# Patient Record
Sex: Male | Born: 1990 | Race: Black or African American | Hispanic: No | Marital: Single | Smoking: Current every day smoker
Health system: Southern US, Community
[De-identification: ages and names within clinical notes are randomized; demographics above are authoritative.]

## PROBLEM LIST (undated history)

## (undated) DIAGNOSIS — S62319A Displaced fracture of base of unspecified metacarpal bone, initial encounter for closed fracture: Secondary | ICD-10-CM

---
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
## 2011-01-25 NOTE — Op Note (Signed)
[place]  Patient:    Nathaniel Richards, Nathaniel Richards Visit Number: [id number] MRN: [id number]          Service Type: DSU Location: [facility] Attending Physician:  Leonia Corona Dictated by:   Hyman Bible. Pendse, M.D. Proc. Date: 05/28/01 Admit Date:  05/28/2001   CC:         Gwenlyn Perking, M.D., [facility]   Operative Report  PREOPERATIVE DIAGNOSIS:  Soft tissue mass, left scapular area.  POSTOPERATIVE DIAGNOSIS:  Soft tissue mass, intramuscular, located in the left trapezius muscle and scapular area.  PROCEDURE:  Excision of soft tissue mass in left trapezius muscle.  SURGEON:  Prabhakar D. Levie Heritage, M.D.  ASSISTANT:  Nurse.  ANESTHESIA:  Nurse.  DESCRIPTION OF PROCEDURE:  Under satisfactory general endotracheal anesthesia, patient in prone position.  Left scapular region was thoroughly prepped and draped in the usual manner.  About a 4 cm long transverse incision was made directly over the palpable mass of the left scapular region, skin and subcutaneous tissue incised, bleeders individually clamped, cut, and electrocoagulated.  Incision carried through the deeper layers.  The mass could not be seen in the subcutaneous plane; hence, it was necessary to dissect the trapezius muscle.  The mass was located deep inside the trapezius and supraspinatus muscle complex.  It was not a well-defined mass; hence, the mass lesion together with the layer of the muscle tissue were excised by gross dissection.  It is possible that I might have entered within the substance of the mass.  The entire mass was removed as well as some remnant over the lateral aspect was removed.  The defect in the muscle was repaired with 3-0 Vicryl inverting interrupted sutures.  Hemostasis accomplished.  Wound was irrigated and subcutaneous tissue apposed with 5-0 Vicryl, skin closed with 5-0 nylon interrupted sutures.  A Penrose drain was left in the depths of  the wound prior to closure, and 0.25% Marcaine with epinephrine was injected locally for postop analgesia.  Appropriate dressing applied.  Throughout the procedure, patients vital signs remained stable.  The patient withstood the procedure well and was transferred to the recovery room in satisfactory general condition. Dictated by:   Hyman Bible Pendse, M.D. Attending Physician:  Leonia Corona DD:  05/28/01 TD:  05/28/01 Job: [id number]

## 2011-07-05 ENCOUNTER — Inpatient Hospital Stay (INDEPENDENT_AMBULATORY_CARE_PROVIDER_SITE_OTHER)
Admission: RE | Admit: 2011-07-05 | Discharge: 2011-07-05 | Disposition: A | Discharge status: Home / Self Care | Payer: Self-pay | Source: Ambulatory Visit | Attending: Family Medicine | Admitting: Family Medicine

## 2011-07-05 DIAGNOSIS — S40019A Contusion of unspecified shoulder, initial encounter: Secondary | ICD-10-CM

## 2011-07-05 DIAGNOSIS — S7010XA Contusion of unspecified thigh, initial encounter: Secondary | ICD-10-CM

## 2011-07-05 DIAGNOSIS — S20219A Contusion of unspecified front wall of thorax, initial encounter: Secondary | ICD-10-CM

## 2011-07-05 LAB — POCT URINALYSIS DIP (DEVICE)
Bilirubin Urine: NEGATIVE
Glucose, UA: NEGATIVE mg/dL
Hgb urine dipstick: NEGATIVE
Ketones, ur: NEGATIVE mg/dL
Leukocytes, UA: NEGATIVE
Nitrite: NEGATIVE
Protein, ur: NEGATIVE mg/dL
Specific Gravity, Urine: 1.02 (ref 1.005–1.030)
Urobilinogen, UA: 0.2 mg/dL (ref 0.0–1.0)
pH: 7 (ref 5.0–8.0)

## 2012-10-01 ENCOUNTER — Emergency Department (HOSPITAL_COMMUNITY)
Admission: EM | Admit: 2012-10-01 | Discharge: 2012-10-01 | Disposition: A | Discharge status: Home / Self Care | Payer: Managed Care, Other (non HMO) | Attending: Emergency Medicine | Admitting: Emergency Medicine

## 2012-10-01 ENCOUNTER — Encounter (HOSPITAL_COMMUNITY): Payer: Self-pay | Admitting: *Deleted

## 2012-10-01 ENCOUNTER — Emergency Department (HOSPITAL_COMMUNITY): Payer: Managed Care, Other (non HMO)

## 2012-10-01 DIAGNOSIS — Z9889 Other specified postprocedural states: Secondary | ICD-10-CM | POA: Insufficient documentation

## 2012-10-01 DIAGNOSIS — Y929 Unspecified place or not applicable: Secondary | ICD-10-CM | POA: Insufficient documentation

## 2012-10-01 DIAGNOSIS — Y9389 Activity, other specified: Secondary | ICD-10-CM | POA: Insufficient documentation

## 2012-10-01 DIAGNOSIS — F17200 Nicotine dependence, unspecified, uncomplicated: Secondary | ICD-10-CM | POA: Insufficient documentation

## 2012-10-01 DIAGNOSIS — W108XXA Fall (on) (from) other stairs and steps, initial encounter: Secondary | ICD-10-CM | POA: Insufficient documentation

## 2012-10-01 DIAGNOSIS — IMO0002 Reserved for concepts with insufficient information to code with codable children: Secondary | ICD-10-CM

## 2012-10-01 DIAGNOSIS — S2341XA Sprain of ribs, initial encounter: Secondary | ICD-10-CM | POA: Insufficient documentation

## 2012-10-01 MED ORDER — METHOCARBAMOL 500 MG PO TABS: 500.0000 mg | ORAL_TABLET | Freq: Two times a day (BID) | ORAL | Status: DC

## 2012-10-01 MED ORDER — OXYCODONE-ACETAMINOPHEN 5-325 MG PO TABS: 2.0000 | ORAL_TABLET | Freq: Four times a day (QID) | ORAL | Status: DC | PRN

## 2012-10-01 MED ORDER — OXYCODONE-ACETAMINOPHEN 5-325 MG PO TABS
2.0000 | ORAL_TABLET | Freq: Once | ORAL | Status: AC
  Administered 2012-10-01: 2 via ORAL
  Filled 2012-10-01: qty 2

## 2012-10-01 NOTE — ED Notes (Signed)
Pt hurried down stairs on Sunday and fell down half a flight of stairs, got up and fell down the other flight of stairs.  Denies loc.  Pt states pain shoots from R thigh to R armpit.  No relief from ibuprofen.  Pt appears in great pain.

## 2012-10-01 NOTE — ED Provider Notes (Signed)
History   This chart was scribed for non-physician practitioner working with Richardean Canal, MD by Gerlean Ren, ED Scribe. This patient was seen in room TR10C/TR10C and the patient's care was started at 5:39 PM.    CSN: [id number]  Arrival date & time 10/01/12  1346   First MD Initiated Contact with Patient 10/01/12 1709      Chief Complaint  Patient presents with  . Back Pain     The history is provided by the patient. No language interpreter was used.   Nathaniel Richards is a 22 y.o. male who presents to the Emergency Department complaining of constant, non-changing, sharp, non-radiating right side rib pain rated as 10/10 with sudden onset after falling down half of a flight of stairs when rushing downstairs 01/19.  No noted head trauma or LOC during fall.  Ibuprofen and tylenol used with no relief to pain.  Pain worsened when ambulating and deep breaths and improved lying supine.  No weakness or numbness.  No known allergies.  Pt is a current everyday smoker and reports occasional alcohol use.   History reviewed. No pertinent past medical history.  Past Surgical History  Procedure Date  . Back surgery     when pt in 1st grade    No family history on file.  History  Substance Use Topics  . Smoking status: Current Every Day Smoker -- 0.1 packs/day    Types: Cigarettes  . Smokeless tobacco: Not on file  . Alcohol Use: Yes     Comment: occasionally      Review of Systems  Musculoskeletal: Negative for back pain.  Neurological: Negative for weakness and numbness.    Allergies  Review of patient's allergies indicates no known allergies.  Home Medications   Current Outpatient Rx  Name  Route  Sig  Dispense  Refill  . ACETAMINOPHEN 500 MG PO TABS   Oral   Take 1,000 mg by mouth once. For pain         . IBUPROFEN 600 MG PO TABS   Oral   Take 1,800 mg by mouth once.           BP 142/100  Pulse 71  Temp 98.3 F (36.8 C) (Oral)  Resp 20  SpO2 95%  Physical  Exam  Nursing note and vitals reviewed. Constitutional: He is oriented to person, place, and time. He appears well-developed and well-nourished. No distress.  HENT:  Head: Normocephalic and atraumatic.  Eyes: EOM are normal.  Neck: Normal range of motion. Neck supple.  Pulmonary/Chest: Effort normal. No respiratory distress.  Musculoskeletal: Normal range of motion.       Tenderness over right ribs and right flank, no contusions or deformities, no tenderness over spine or bilateral hips.  Neurological: He is alert and oriented to person, place, and time.  Skin: Skin is warm and dry.  Psychiatric: He has a normal mood and affect. His behavior is normal.    ED Course  Procedures (including critical care time) DIAGNOSTIC STUDIES: Oxygen Saturation is 95% on room air, adequate by my interpretation.    COORDINATION OF CARE: 5:41 PM- Patient informed of clinical course including XR, understands medical decision-making process, and agrees with plan.   Dg Ribs Unilateral W/chest Right  10/01/2012  *RADIOLOGY REPORT*  Clinical Data: Larey Seat down stairs.  Right axillary rib pain.  RIGHT RIBS AND CHEST - 3+ VIEW  Comparison: None.  Findings: Lungs are clear bilaterally. Heart and mediastinum are within normal limits.  Trachea is midline.  Negative for a pneumothorax.  No evidence for a displaced right rib fracture.  IMPRESSION: No acute chest findings.  No evidence for a displaced right rib fracture.   Original Report Authenticated By: Richarda Overlie, M.D.      1. Sprain and strain of ribs       MDM  22 year old male and fell on the stairs, no evidence for rib fracture, though I am still mildly suspicious. Will treat the patient with pain medicine and muscle relaxer, and have him followup with his primary care doctor.  I personally performed the services described in this documentation, which was scribed in my presence. The recorded information has been reviewed and is  accurate.          Roxy Horseman, PA-C 10/01/12 1826

## 2012-10-01 NOTE — ED Notes (Signed)
Pt states he fell down a flight of stairs on Sunday and c/o right side pain. Pt states pain is worse with any movement. Pt took ibuprofen and tylenol with no relief. Pt able to ambulate with quick steady gait. NAD noted.

## 2012-10-01 NOTE — ED Provider Notes (Signed)
Medical screening examination/treatment/procedure(s) were performed by non-physician practitioner and as supervising physician I was immediately available for consultation/collaboration.   Richardean Canal, MD 10/01/12 [phone]

## 2013-05-15 ENCOUNTER — Emergency Department (HOSPITAL_COMMUNITY)
Admission: EM | Admit: 2013-05-15 | Discharge: 2013-05-15 | Disposition: A | Discharge status: Home / Self Care | Payer: Managed Care, Other (non HMO) | Attending: Emergency Medicine | Admitting: Emergency Medicine

## 2013-05-15 ENCOUNTER — Encounter (HOSPITAL_COMMUNITY): Payer: Self-pay | Admitting: *Deleted

## 2013-05-15 ENCOUNTER — Emergency Department (HOSPITAL_COMMUNITY): Payer: Managed Care, Other (non HMO)

## 2013-05-15 DIAGNOSIS — Y9389 Activity, other specified: Secondary | ICD-10-CM | POA: Insufficient documentation

## 2013-05-15 DIAGNOSIS — W230XXA Caught, crushed, jammed, or pinched between moving objects, initial encounter: Secondary | ICD-10-CM | POA: Insufficient documentation

## 2013-05-15 DIAGNOSIS — S62319A Displaced fracture of base of unspecified metacarpal bone, initial encounter for closed fracture: Secondary | ICD-10-CM

## 2013-05-15 DIAGNOSIS — F17200 Nicotine dependence, unspecified, uncomplicated: Secondary | ICD-10-CM | POA: Insufficient documentation

## 2013-05-15 DIAGNOSIS — Y9289 Other specified places as the place of occurrence of the external cause: Secondary | ICD-10-CM | POA: Insufficient documentation

## 2013-05-15 DIAGNOSIS — S62306A Unspecified fracture of fifth metacarpal bone, right hand, initial encounter for closed fracture: Secondary | ICD-10-CM

## 2013-05-15 HISTORY — DX: Displaced fracture of base of unspecified metacarpal bone, initial encounter for closed fracture: S62.319A

## 2013-05-15 MED ORDER — OXYCODONE-ACETAMINOPHEN 5-325 MG PO TABS: 2.0000 | ORAL_TABLET | ORAL | Status: DC | PRN

## 2013-05-15 MED ORDER — OXYCODONE-ACETAMINOPHEN 5-325 MG PO TABS
2.0000 | ORAL_TABLET | Freq: Once | ORAL | Status: AC
  Administered 2013-05-15: 2 via ORAL
  Filled 2013-05-15: qty 2

## 2013-05-15 NOTE — Progress Notes (Signed)
Orthopedic Tech Progress Note Patient Details:  Nathaniel Richards 15-Oct-1990 [id number]  Ortho Devices Type of Ortho Device: Ace wrap;Ulna gutter splint Ortho Device/Splint Interventions: Ordered;Application   Jennye Moccasin 05/15/2013, 8:25 PM
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
## 2013-05-15 NOTE — ED Notes (Signed)
Paged ortho for splint.

## 2013-05-15 NOTE — ED Notes (Signed)
Ortho at bedside.
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
## 2013-05-16 NOTE — ED Provider Notes (Signed)
Medical screening examination/treatment/procedure(s) were performed by non-physician practitioner and as supervising physician I was immediately available for consultation/collaboration.  Zaivion Kundrat, MD 05/16/13 1507 

## 2013-05-21 ENCOUNTER — Encounter (HOSPITAL_BASED_OUTPATIENT_CLINIC_OR_DEPARTMENT_OTHER): Payer: Self-pay | Admitting: *Deleted

## 2013-05-24 ENCOUNTER — Ambulatory Visit (HOSPITAL_BASED_OUTPATIENT_CLINIC_OR_DEPARTMENT_OTHER)
Admission: RE | Admit: 2013-05-24 | Discharge: 2013-05-24 | Disposition: A | Discharge status: Home / Self Care | Payer: Managed Care, Other (non HMO) | Source: Ambulatory Visit | Attending: Orthopedic Surgery | Admitting: Orthopedic Surgery

## 2013-05-24 ENCOUNTER — Ambulatory Visit (HOSPITAL_BASED_OUTPATIENT_CLINIC_OR_DEPARTMENT_OTHER): Payer: Managed Care, Other (non HMO) | Admitting: Certified Registered Nurse Anesthetist

## 2013-05-24 ENCOUNTER — Encounter (HOSPITAL_BASED_OUTPATIENT_CLINIC_OR_DEPARTMENT_OTHER): Payer: Self-pay | Admitting: Certified Registered Nurse Anesthetist

## 2013-05-24 ENCOUNTER — Encounter (HOSPITAL_BASED_OUTPATIENT_CLINIC_OR_DEPARTMENT_OTHER)
Admission: RE | Disposition: A | Discharge status: Home / Self Care | Payer: Self-pay | Source: Ambulatory Visit | Attending: Orthopedic Surgery

## 2013-05-24 ENCOUNTER — Encounter (HOSPITAL_BASED_OUTPATIENT_CLINIC_OR_DEPARTMENT_OTHER): Payer: Self-pay | Admitting: *Deleted

## 2013-05-24 DIAGNOSIS — S62309A Unspecified fracture of unspecified metacarpal bone, initial encounter for closed fracture: Secondary | ICD-10-CM

## 2013-05-24 DIAGNOSIS — S62319A Displaced fracture of base of unspecified metacarpal bone, initial encounter for closed fracture: Secondary | ICD-10-CM | POA: Insufficient documentation

## 2013-05-24 DIAGNOSIS — X58XXXA Exposure to other specified factors, initial encounter: Secondary | ICD-10-CM | POA: Insufficient documentation

## 2013-05-24 DIAGNOSIS — F17200 Nicotine dependence, unspecified, uncomplicated: Secondary | ICD-10-CM | POA: Insufficient documentation

## 2013-05-24 HISTORY — DX: Displaced fracture of base of unspecified metacarpal bone, initial encounter for closed fracture: S62.319A

## 2013-05-24 HISTORY — PX: CLOSED REDUCTION FINGER WITH PERCUTANEOUS PINNING: SHX5612

## 2013-05-24 SURGERY — CLOSED REDUCTION, FINGER, WITH PERCUTANEOUS PINNING
Anesthesia: General | Site: Finger | Laterality: Right | Wound class: Clean

## 2013-05-24 MED ORDER — FENTANYL CITRATE 0.05 MG/ML IJ SOLN
INTRAMUSCULAR | Status: DC | PRN
  Administered 2013-05-24: 100 ug via INTRAVENOUS

## 2013-05-24 MED ORDER — OXYCODONE HCL 5 MG/5ML PO SOLN: 5.0000 mg | Freq: Once | ORAL | Status: DC | PRN

## 2013-05-24 MED ORDER — MIDAZOLAM HCL 5 MG/5ML IJ SOLN
INTRAMUSCULAR | Status: DC | PRN
  Administered 2013-05-24: 2 mg via INTRAVENOUS

## 2013-05-24 MED ORDER — KETOROLAC TROMETHAMINE 30 MG/ML IJ SOLN
INTRAMUSCULAR | Status: DC | PRN
  Administered 2013-05-24: 30 mg via INTRAVENOUS

## 2013-05-24 MED ORDER — OXYCODONE-ACETAMINOPHEN 10-325 MG PO TABS: 1.0000 | ORAL_TABLET | ORAL | Status: DC | PRN

## 2013-05-24 MED ORDER — EPHEDRINE SULFATE 50 MG/ML IJ SOLN
INTRAMUSCULAR | Status: DC | PRN
  Administered 2013-05-24 (×4): 10 mg via INTRAVENOUS

## 2013-05-24 MED ORDER — HYDROMORPHONE HCL PF 1 MG/ML IJ SOLN
0.2500 mg | INTRAMUSCULAR | Status: DC | PRN
  Administered 2013-05-24 (×4): 0.5 mg via INTRAVENOUS

## 2013-05-24 MED ORDER — DEXAMETHASONE SODIUM PHOSPHATE 10 MG/ML IJ SOLN
INTRAMUSCULAR | Status: DC | PRN
  Administered 2013-05-24: 10 mg via INTRAVENOUS

## 2013-05-24 MED ORDER — PROPOFOL 10 MG/ML IV BOLUS
INTRAVENOUS | Status: DC | PRN
  Administered 2013-05-24: 250 mg via INTRAVENOUS

## 2013-05-24 MED ORDER — LACTATED RINGERS IV SOLN
INTRAVENOUS | Status: DC
  Administered 2013-05-24 (×2): via INTRAVENOUS

## 2013-05-24 MED ORDER — LIDOCAINE HCL (CARDIAC) 20 MG/ML IV SOLN
INTRAVENOUS | Status: DC | PRN
  Administered 2013-05-24: 100 mg via INTRAVENOUS

## 2013-05-24 MED ORDER — DEXTROSE 5 % IV SOLN
3.0000 g | INTRAVENOUS | Status: AC
  Administered 2013-05-24: 3 g via INTRAVENOUS

## 2013-05-24 MED ORDER — CHLORHEXIDINE GLUCONATE 4 % EX LIQD: 60.0000 mL | Freq: Once | CUTANEOUS | Status: DC

## 2013-05-24 MED ORDER — BUPIVACAINE HCL (PF) 0.5 % IJ SOLN
INTRAMUSCULAR | Status: DC | PRN
  Administered 2013-05-24: 9 mL

## 2013-05-24 MED ORDER — DOCUSATE SODIUM 100 MG PO CAPS: 100.0000 mg | ORAL_CAPSULE | Freq: Two times a day (BID) | ORAL | Status: DC

## 2013-05-24 MED ORDER — OXYCODONE HCL 5 MG PO TABS
5.0000 mg | ORAL_TABLET | Freq: Once | ORAL | Status: DC | PRN
Start: 2013-05-24 — End: 2013-05-24

## 2013-05-24 MED ORDER — MIDAZOLAM HCL 2 MG/2ML IJ SOLN: 1.0000 mg | INTRAMUSCULAR | Status: DC | PRN

## 2013-05-24 MED ORDER — MIDAZOLAM HCL 2 MG/ML PO SYRP: 12.0000 mg | ORAL_SOLUTION | Freq: Once | ORAL | Status: DC | PRN

## 2013-05-24 MED ORDER — FENTANYL CITRATE 0.05 MG/ML IJ SOLN: 50.0000 ug | INTRAMUSCULAR | Status: DC | PRN

## 2013-05-24 SURGICAL SUPPLY — 55 items
APL SKNCLS STERI-STRIP NONHPOA (GAUZE/BANDAGES/DRESSINGS)
BANDAGE CONFORM 3  STR LF (GAUZE/BANDAGES/DRESSINGS) ×1 IMPLANT
BANDAGE ELASTIC 3 VELCRO ST LF (GAUZE/BANDAGES/DRESSINGS) ×2 IMPLANT
BENZOIN TINCTURE PRP APPL 2/3 (GAUZE/BANDAGES/DRESSINGS) IMPLANT
BLADE SURG 15 STRL LF DISP TIS (BLADE) ×2 IMPLANT
BLADE SURG 15 STRL SS (BLADE) ×2
BNDG CMPR 9X4 STRL LF SNTH (GAUZE/BANDAGES/DRESSINGS) ×1
BNDG CMPR MD 5X2 ELC HKLP STRL (GAUZE/BANDAGES/DRESSINGS)
BNDG ELASTIC 2 VLCR STRL LF (GAUZE/BANDAGES/DRESSINGS) IMPLANT
BNDG ESMARK 4X9 LF (GAUZE/BANDAGES/DRESSINGS) ×2 IMPLANT
CANISTER SUCTION 1200CC (MISCELLANEOUS) IMPLANT
CORDS BIPOLAR (ELECTRODE) ×1 IMPLANT
COVER TABLE BACK 60X90 (DRAPES) ×2 IMPLANT
CUFF TOURNIQUET SINGLE 18IN (TOURNIQUET CUFF) IMPLANT
CUFF TOURNIQUET SINGLE 24IN (TOURNIQUET CUFF) ×1 IMPLANT
DECANTER SPIKE VIAL GLASS SM (MISCELLANEOUS) IMPLANT
DRAPE EXTREMITY T 121X128X90 (DRAPE) ×2 IMPLANT
DRAPE OEC MINIVIEW 54X84 (DRAPES) ×2 IMPLANT
DRAPE SURG 17X23 STRL (DRAPES) ×2 IMPLANT
DRSG EMULSION OIL 3X3 NADH (GAUZE/BANDAGES/DRESSINGS) ×2 IMPLANT
GAUZE SPONGE 4X4 16PLY XRAY LF (GAUZE/BANDAGES/DRESSINGS) IMPLANT
GAUZE XEROFORM 1X8 LF (GAUZE/BANDAGES/DRESSINGS) ×1 IMPLANT
GLOVE BIO SURGEON STRL SZ8 (GLOVE) ×2 IMPLANT
GLOVE BIOGEL PI IND STRL 8.5 (GLOVE) ×1 IMPLANT
GLOVE BIOGEL PI INDICATOR 8.5 (GLOVE) ×1
GLOVE EXAM NITRILE EXT CUFF MD (GLOVE) ×1 IMPLANT
GLOVE SURG SS PI 7.0 STRL IVOR (GLOVE) ×1 IMPLANT
GOWN PREVENTION PLUS XLARGE (GOWN DISPOSABLE) ×2 IMPLANT
GOWN STRL REIN 2XL XLG LVL4 (GOWN DISPOSABLE) ×2 IMPLANT
K-WIRE .045X4 (WIRE) ×3 IMPLANT
NDL HYPO 25X1 1.5 SAFETY (NEEDLE) IMPLANT
NEEDLE HYPO 25X1 1.5 SAFETY (NEEDLE) ×2 IMPLANT
NS IRRIG 1000ML POUR BTL (IV SOLUTION) ×1 IMPLANT
PACK BASIN DAY SURGERY FS (CUSTOM PROCEDURE TRAY) ×2 IMPLANT
PAD CAST 3X4 CTTN HI CHSV (CAST SUPPLIES) ×1 IMPLANT
PADDING CAST ABS 3INX4YD NS (CAST SUPPLIES)
PADDING CAST ABS COTTON 3X4 (CAST SUPPLIES) ×1 IMPLANT
PADDING CAST COTTON 3X4 STRL (CAST SUPPLIES) ×4
PADDING UNDERCAST 2  STERILE (CAST SUPPLIES) IMPLANT
SHEET MEDIUM DRAPE 40X70 STRL (DRAPES) ×1 IMPLANT
SPLINT FIBERGLASS 3X35 (CAST SUPPLIES) IMPLANT
SPLINT FIBERGLASS 4X30 (CAST SUPPLIES) ×1 IMPLANT
STOCKINETTE 4X48 STRL (DRAPES) ×2 IMPLANT
STRIP CLOSURE SKIN 1/2X4 (GAUZE/BANDAGES/DRESSINGS) IMPLANT
SUCTION FRAZIER TIP 10 FR DISP (SUCTIONS) ×1 IMPLANT
SUT MNCRL AB 3-0 PS2 18 (SUTURE) ×1 IMPLANT
SUT MON AB 4-0 PC3 18 (SUTURE) IMPLANT
SUT PROLENE 4 0 PS 2 18 (SUTURE) ×1 IMPLANT
SUT VIC AB 3-0 FS2 27 (SUTURE) ×1 IMPLANT
SYR BULB 3OZ (MISCELLANEOUS) ×1 IMPLANT
SYR CONTROL 10ML LL (SYRINGE) ×1 IMPLANT
TOWEL OR 17X24 6PK STRL BLUE (TOWEL DISPOSABLE) ×3 IMPLANT
TRAY DSU PREP LF (CUSTOM PROCEDURE TRAY) ×2 IMPLANT
TUBE CONNECTING 20X1/4 (TUBING) IMPLANT
UNDERPAD 30X30 INCONTINENT (UNDERPADS AND DIAPERS) ×2 IMPLANT

## 2013-05-24 NOTE — Anesthesia Procedure Notes (Signed)
Procedure Name: LMA Insertion Date/Time: 05/24/2013 11:53 AM Performed by: Caren Macadam Pre-anesthesia Checklist: Patient identified, Emergency Drugs available, Suction available and Patient being monitored Patient Re-evaluated:Patient Re-evaluated prior to inductionOxygen Delivery Method: Circle System Utilized Preoxygenation: Pre-oxygenation with 100% oxygen Intubation Type: IV induction Ventilation: Mask ventilation without difficulty LMA: LMA inserted LMA Size: 5.0 Number of attempts: 1 Airway Equipment and Method: bite block Placement Confirmation: positive ETCO2 and breath sounds checked- equal and bilateral Tube secured with: Tape Dental Injury: Teeth and Oropharynx as per pre-operative assessment

## 2013-05-24 NOTE — H&P (Signed)
Nathaniel Richards is an 22 y.o. male.   Chief Complaint: right hand injury HPI: Pt sustained injury to right hand Pt followed in office with displaced metacarpal base fracture Pt here for surgery No prior injury to hand  Past Medical History  Diagnosis Date  . Fracture of metacarpal base of right hand, closed 05/15/2013    small finger    Past Surgical History  Procedure Laterality Date  . Soft tissue mass excision Left 05/28/2001    trapezius muscle    History reviewed. No pertinent family history. Social History:  reports that he has been smoking Cigarettes.  He has been smoking about 0.00 packs per day for the past 4 years. He has never used smokeless tobacco. He reports that  drinks alcohol. He reports that he does not use illicit drugs.  Allergies: No Known Allergies  Medications Prior to Admission  Medication Sig Dispense Refill  . oxyCODONE-acetaminophen (PERCOCET/ROXICET) 5-325 MG per tablet Take 2 tablets by mouth every 4 (four) hours as needed for pain.  9 tablet  0    No results found for this or any previous visit (from the past 48 hour(s)). No results found.  NO RECENT ILLNESSES OR HOSPITALIZATIONS  Blood pressure 137/87, pulse 71, temperature 98.5 F (36.9 C), temperature source Oral, resp. rate 20, height 6\' 2"  (1.88 m), weight 161.934 kg (357 lb), SpO2 98.00%. General Appearance:  Alert, cooperative, no distress, appears stated age  Head:  Normocephalic, without obvious abnormality, atraumatic  Eyes:  Pupils equal, conjunctiva/corneas clear,         Throat: Lips, mucosa, and tongue normal; teeth and gums normal  Neck: No visible masses     Lungs:   respirations unlabored  Chest Wall:  No tenderness or deformity  Heart:  Regular rate and rhythm,  Abdomen:   Soft, non-tender,         Extremities: RIGHT HAND: ULNAR GUTTER SPLINT GOOD MOBILITY OF INDEX AND LONG FINGER AND THUMB  FINGERS WARM WELL PERFUSED  Pulses: 2+ and symmetric  Skin: Skin color,  texture, turgor normal, no rashes or lesions     Neurologic: Normal   Assessment/Plan RIGHT SMALL FINGER METACARPAL BASE FRACTURE DISPLACED AND INTRAARTICULAR  RIGHT SMALL FINGER CLOSED REDUCTION AND PINNING POSSIBLE OPEN REDUCTION AND INTERNAL FIXATION  R/B/A DISCUSSED WITH PT IN OFFICE.  PT VOICED UNDERSTANDING OF PLAN CONSENT SIGNED DAY OF SURGERY PT SEEN AND EXAMINED PRIOR TO OPERATIVE PROCEDURE/DAY OF SURGERY SITE MARKED. QUESTIONS ANSWERED WILL GO HOME FOLLOWING SURGERY  [name] 05/24/2013, 11:40 AM

## 2013-05-24 NOTE — Anesthesia Postprocedure Evaluation (Signed)
  Anesthesia Post-op Note  Patient: Nathaniel Richards  Procedure(s) Performed: Procedure(s): RIGHT SMALL FINGER CLOSED REDUCTION  WITH PERCUTANEOUS PINNING (Right)  Patient Location: PACU  Anesthesia Type:General  Level of Consciousness: awake, alert  and oriented  Airway and Oxygen Therapy: Patient Spontanous Breathing  Post-op Pain: mild  Post-op Assessment: Post-op Vital signs reviewed, Patient's Cardiovascular Status Stable, Respiratory Function Stable, Patent Airway and No signs of Nausea or vomiting  Post-op Vital Signs: Reviewed and stable  Complications: No apparent anesthesia complications

## 2013-05-24 NOTE — Transfer of Care (Signed)
Immediate Anesthesia Transfer of Care Note  Patient: Nathaniel Richards  Procedure(s) Performed: Procedure(s): RIGHT SMALL FINGER CLOSED REDUCTION  WITH PERCUTANEOUS PINNING (Right)  Patient Location: PACU  Anesthesia Type:General  Level of Consciousness: awake and alert   Airway & Oxygen Therapy: Patient Spontanous Breathing and Patient connected to face mask oxygen  Post-op Assessment: Report given to PACU RN and Post -op Vital signs reviewed and stable  Post vital signs: Reviewed and stable  Complications: No apparent anesthesia complications
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
## 2013-05-24 NOTE — Brief Op Note (Signed)
05/24/2013  11:44 AM  PATIENT:  Nathaniel Richards  22 y.o. male  PRE-OPERATIVE DIAGNOSIS:  Right Small Finger Metacarpal Base Fracture  POST-OPERATIVE DIAGNOSIS:  RIGHT SMALL FINGER METACARPAL BASE FRACTURE  PROCEDURE:  Procedure(s): RIGHT SMALL FINGER CLOSED REDUCTION  WITH PERCUTANEOUS PINNING (Right) POSSIBLE OPEN REDUCTION INTERNAL FIXATION (ORIF) METACARPAL RIGHT SMALL FINGER (Right)  SURGEON:  Surgeon(s) and Role:    * [name], MD - Primary  PHYSICIAN ASSISTANT: NONE  ASSISTANTS: none   ANESTHESIA:   general  EBL:     BLOOD ADMINISTERED:none  DRAINS: none   LOCAL MEDICATIONS USED:  MARCAINE     SPECIMEN:  No Specimen  DISPOSITION OF SPECIMEN:  N/A  COUNTS:  YES  TOURNIQUET:    DICTATION: .[id number]  PLAN OF CARE: Discharge to home after PACU  PATIENT DISPOSITION:  PACU - hemodynamically stable.   Delay start of Pharmacological VTE agent (>24hrs) due to surgical blood loss or risk of bleeding: not applicable

## 2013-05-25 ENCOUNTER — Encounter (HOSPITAL_BASED_OUTPATIENT_CLINIC_OR_DEPARTMENT_OTHER): Payer: Self-pay | Admitting: Orthopedic Surgery

## 2013-05-25 NOTE — Op Note (Deleted)
NAME:  Nathaniel Richards, Nathaniel Richards              ACCOUNT NO.:  [id number]  MEDICAL RECORD NO.:  [id number]  LOCATION:  TR05C                        FACILITY:  [facility]  PHYSICIAN:  Reniya Mcclees W Ainara Eldridge IV, MD  DATE OF BIRTH:  09/27/1990  DATE OF PROCEDURE:  05/24/2013 DATE OF DISCHARGE:  05/24/2013                              OPERATIVE REPORT   PREOPERATIVE DIAGNOSIS:  Right small finger metacarpal base fracture involving the articular surface of the CMC joint, reverse Bennett type equivalent.  POSTOPERATIVE DIAGNOSIS:  Right small finger metacarpal base fracture involving the articular surface of the CMC joint, reverse Bennett type equivalent.  ATTENDING PHYSICIAN:  Ugo Thoma W Darry Kelnhofer IV, MD, who scrubbed and present for the entire procedure.  ASSISTANT SURGEON:  None.  ANESTHESIA:  General via LMA.  TOURNIQUET TIME:  0 minutes.  SURGICAL PROCEDURE: 1. Closed reduction and percutaneous skeletal fixation of unstable     metacarpal base fracture involving the articular surface of the     thumb CMC joint, reverse Bennett fracture. 2. Radiographs, 3 views, right hand.  SURGICAL IMPLANTS:  Three 0.045 K-wires.  SURGICAL INDICATIONS:  Nathaniel Richards is a right-hand-dominant gentleman, who sustained a closed injury to his right hand.  The patient was seen and evaluated in the office, and based on his injuries, recommended that he undergo the above procedure.  Risks, benefits, and alternatives were discussed in detail with the patient.  Signed informed consent was obtained.  Risks include, but not limited to bleeding, infection, damage to nearby nerves, arteries, or tendons, loss motion of wrist and digits, incomplete relief of symptoms, and need for further surgical intervention.  DESCRIPTION OF PROCEDURE:  The patient was properly identified in the preop holding area and marked with a permanent marker made on the right hand to indicate the correct operative site.  The patient was then brought back to  the operating room and placed supine on the anesthesia room table.  General anesthesia was administered.  The patient tolerated this well.  A well-padded tourniquet was then placed in the right brachium and sealed with 1000 drape.  The right upper extremity was then prepped and draped normal sterile fashion.  Time-out was called, correct side was identified, and procedure then begun.  Attention was then turned to the right hand where after the small finger was then brought out in a longitudinal traction.  This reduced the metacarpal base fracture nicely.  Following this, two 0.045 K-wires were then placed from the small finger metacarpal shaft into the ring finger metacarpal shaft.  After this, an additional third K-wire was then placed in a metacarpal shaft across the CMC joint across the fracture site stabilizing the joint even further.  The K-wires were then cut and bent and left out of the skin.  A 10 mL 0.25% Marcaine infiltrated locally. Xeroform bolster dressing was then applied around the pin sites.  Final radiographs of the hand were then obtained.  The patient was then placed in a well-padded ulnar gutter splint, extubated, and taken to recovery room in good condition.  RADIOGRAPHS:  Three views of the hand do show the K-wire fixation in place.  There was good position in both planes.  POSTPROCEDURE   PLAN:  The patient will be discharged to home, seen back in the office in approximately 2 weeks for wound check, pin check, x- rays out of the splint, application of short-arm ulnar gutter cast for a total of 2 more weeks, pins out at the 4-week mark, then transition to an ulnar gutter splint with therapy working on his active range of motion.  Radiographs at that visit.     Elwin Tsou W Ryan Ogborn IV, MD     FWO/MEDQ  D:  05/24/2013  T:  05/25/2013  Job:  [id number] 

## 2013-05-25 NOTE — Op Note (Signed)
Nathaniel Richards, Nathaniel Richards              ACCOUNT NO.:  [id number]  MEDICAL RECORD NO.:  [id number]  LOCATION:  TR05C                        FACILITY:  [facility]  PHYSICIAN:  Madelynn Done, MD  DATE OF BIRTH:  02/14/1991  DATE OF PROCEDURE:  05/24/2013 DATE OF DISCHARGE:  05/24/2013                              OPERATIVE REPORT   PREOPERATIVE DIAGNOSIS:  Right small finger metacarpal base fracture involving the articular surface of the CMC joint, reverse Bennett type equivalent.  POSTOPERATIVE DIAGNOSIS:  Right small finger metacarpal base fracture involving the articular surface of the CMC joint, reverse Bennett type equivalent.  ATTENDING PHYSICIAN:  Madelynn Done, MD, who scrubbed and present for the entire procedure.  ASSISTANT SURGEON:  None.  ANESTHESIA:  General via LMA.  TOURNIQUET TIME:  0 minutes.  SURGICAL PROCEDURE: 1. Closed reduction and percutaneous skeletal fixation of unstable     metacarpal base fracture involving the articular surface of the     thumb CMC joint, reverse Bennett fracture. 2. Radiographs, 3 views, right hand.  SURGICAL IMPLANTS:  Three 0.045 K-wires.  SURGICAL INDICATIONS:  Mr. Lahaie is a right-hand-dominant gentleman, who sustained a closed injury to his right hand.  The patient was seen and evaluated in the office, and based on his injuries, recommended that he undergo the above procedure.  Risks, benefits, and alternatives were discussed in detail with the patient.  Signed informed consent was obtained.  Risks include, but not limited to bleeding, infection, damage to nearby nerves, arteries, or tendons, loss motion of wrist and digits, incomplete relief of symptoms, and need for further surgical intervention.  DESCRIPTION OF PROCEDURE:  The patient was properly identified in the preop holding area and marked with a permanent marker made on the right hand to indicate the correct operative site.  The patient was then brought back to  the operating room and placed supine on the anesthesia room table.  General anesthesia was administered.  The patient tolerated this well.  A well-padded tourniquet was then placed in the right brachium and sealed with 1000 drape.  The right upper extremity was then prepped and draped normal sterile fashion.  Time-out was called, correct side was identified, and procedure then begun.  Attention was then turned to the right hand where after the small finger was then brought out in a longitudinal traction.  This reduced the metacarpal base fracture nicely.  Following this, two 0.045 K-wires were then placed from the small finger metacarpal shaft into the ring finger metacarpal shaft.  After this, an additional third K-wire was then placed in a metacarpal shaft across the [facility] joint across the fracture site stabilizing the joint even further.  The K-wires were then cut and bent and left out of the skin.  A 10 mL 0.25% Marcaine infiltrated locally. Xeroform bolster dressing was then applied around the pin sites.  Final radiographs of the hand were then obtained.  The patient was then placed in a well-padded ulnar gutter splint, extubated, and taken to recovery room in good condition.  RADIOGRAPHS:  Three views of the hand do show the K-wire fixation in place.  There was good position in both planes.  POSTPROCEDURE  PLAN:  The patient will be discharged to home, seen back in the office in approximately 2 weeks for wound check, pin check, x- rays out of the splint, application of short-arm ulnar gutter cast for a total of 2 more weeks, pins out at the 4-week mark, then transition to an ulnar gutter splint with therapy working on his active range of motion.  Radiographs at that visit.     Madelynn Done, MD     FWO/MEDQ  D:  05/24/2013  T:  05/25/2013  Job:  [id number]

## 2013-10-10 ENCOUNTER — Encounter (HOSPITAL_COMMUNITY): Payer: Self-pay | Admitting: Emergency Medicine

## 2013-10-10 ENCOUNTER — Emergency Department (HOSPITAL_COMMUNITY)
Admission: EM | Admit: 2013-10-10 | Discharge: 2013-10-10 | Disposition: A | Discharge status: Home / Self Care | Payer: Managed Care, Other (non HMO) | Attending: Emergency Medicine | Admitting: Emergency Medicine

## 2013-10-10 DIAGNOSIS — F17200 Nicotine dependence, unspecified, uncomplicated: Secondary | ICD-10-CM | POA: Insufficient documentation

## 2013-10-10 DIAGNOSIS — Z8781 Personal history of (healed) traumatic fracture: Secondary | ICD-10-CM | POA: Insufficient documentation

## 2013-10-10 DIAGNOSIS — L0591 Pilonidal cyst without abscess: Secondary | ICD-10-CM

## 2013-10-10 DIAGNOSIS — Z79899 Other long term (current) drug therapy: Secondary | ICD-10-CM | POA: Insufficient documentation

## 2013-10-10 MED ORDER — HYDROCODONE-ACETAMINOPHEN 5-325 MG PO TABS
1.0000 | ORAL_TABLET | Freq: Once | ORAL | Status: AC
  Administered 2013-10-10: 1 via ORAL
  Filled 2013-10-10: qty 1

## 2013-10-10 MED ORDER — LIDOCAINE-EPINEPHRINE (PF) 2 %-1:200000 IJ SOLN
INTRAMUSCULAR | Status: AC
  Filled 2013-10-10: qty 10

## 2013-10-10 MED ORDER — HYDROCODONE-ACETAMINOPHEN 5-325 MG PO TABS: 1.0000 | ORAL_TABLET | ORAL | Status: DC | PRN

## 2013-10-10 MED ORDER — METRONIDAZOLE 500 MG PO TABS: 500.0000 mg | ORAL_TABLET | Freq: Three times a day (TID) | ORAL | Status: DC

## 2013-10-10 MED ORDER — AMOXICILLIN-POT CLAVULANATE 875-125 MG PO TABS: 1.0000 | ORAL_TABLET | Freq: Two times a day (BID) | ORAL | Status: DC

## 2013-10-10 MED ORDER — LIDOCAINE-EPINEPHRINE (PF) 2 %-1:200000 IJ SOLN: 20.0000 mL | Freq: Once | INTRAMUSCULAR | Status: DC

## 2013-10-10 NOTE — Discharge Instructions (Signed)
Call for a follow up appointment with a Family or Primary Care Provider in 2-3 days for wound check and removal of the packing. Don't soak the area until the packing is removed. Call Dr. Daphine DeutscherMartin for further treatment of your recurring pilonidal cyst. Return if Symptoms worsen.   Take medication as prescribed.

## 2013-10-10 NOTE — ED Provider Notes (Signed)
CSN: [id number]     Arrival date & time 10/10/13  1212 History   First MD Initiated Contact with Patient 10/10/13 1224     Chief Complaint  Patient presents with  . Cyst  . Pain   (Consider location/radiation/quality/duration/timing/severity/associated sxs/prior Treatment) HPI Comments: Nathaniel Richards is a 23 year-old male , presenting the Emergency Department with a chief complaint of Abscess to gluteal cleft.  He reports he has been able to "pop" previous episodes of swelling and pain.  He reports he has tried warm compresses and squeezing the area without relief.  Denies history of DM or other immunocompromised state.    Patient is a 23 y.o. male presenting with abscess. The history is provided by the patient and medical records. No language interpreter was used.  Abscess Location:  Ano-genital Abscess quality: painful   Abscess quality: not draining   Duration:  1 week Progression:  Worsening Pain details:    Quality:  Pressure and throbbing   Progression:  Worsening (Worsening since Thursday) Associated symptoms: no fever, no nausea and no vomiting     Past Medical History  Diagnosis Date  . Fracture of metacarpal base of right hand, closed 05/15/2013    small finger   Past Surgical History  Procedure Laterality Date  . Soft tissue mass excision Left 05/28/2001    trapezius muscle  . Closed reduction finger with percutaneous pinning Right 05/24/2013    Procedure: RIGHT SMALL FINGER CLOSED REDUCTION  WITH PERCUTANEOUS PINNING;  Surgeon: Sharma CovertFred W Ortmann, MD;  Location: [facility];  Service: Orthopedics;  Laterality: Right;   No family history on file. History  Substance Use Topics  . Smoking status: Current Every Day Smoker -- 4 years    Types: Cigarettes  . Smokeless tobacco: Never Used     Comment: 4-5 cig./day  . Alcohol Use: Yes     Comment: occasionally    Review of Systems  Constitutional: Negative for fever and chills.  Gastrointestinal: Negative  for nausea, vomiting, diarrhea and constipation.  Skin: Positive for color change and wound.    Allergies  Review of patient's allergies indicates no known allergies.  Home Medications   Current Outpatient Rx  Name  Route  Sig  Dispense  Refill  . acetaminophen (TYLENOL) 500 MG tablet   Oral   Take 1,500 mg by mouth once.         . Benzocaine (BOIL EASE MAXIMUM STRENGTH) 20 % OINT   Apply externally   Apply 1 application topically 3 (three) times daily.          BP 159/94  Pulse 68  Temp(Src) 98.2 F (36.8 C) (Oral)  Resp 16  SpO2 99% Physical Exam  Nursing note and vitals reviewed. Constitutional: He is oriented to person, place, and time. He appears well-developed and well-nourished. No distress.  HENT:  Head: Atraumatic.  Eyes: No scleral icterus.  Neck: Neck supple.  Cardiovascular: Normal rate.   Pulmonary/Chest: Effort normal.  Neurological: He is alert and oriented to person, place, and time.  Skin: Skin is dry. He is not diaphoretic.     5 x 3 cm area of fluctuance with surrounding induration.   Psychiatric: He has a normal mood and affect. His behavior is normal.    ED Course  INCISION AND DRAINAGE Date/Time: 10/10/2013 1:10 PM Performed by: Clabe SealPARKER, Kennidi Yoshida M Authorized by: Clabe SealPARKER, Abdiel Blackerby M Consent: Verbal consent obtained. Risks and benefits: risks, benefits and alternatives were discussed Consent given by: patient Patient  understanding: patient states understanding of the procedure being performed Patient consent: the patient's understanding of the procedure matches consent given Procedure consent: procedure consent matches procedure scheduled Required items: required blood products, implants, devices, and special equipment available Patient identity confirmed: verbally with patient and hospital-assigned identification number Time out: Immediately prior to procedure a "time out" was called to verify the correct patient, procedure, equipment, support  staff and site/side marked as required. Type: pilonidal cyst Body area: anogenital Location details: gluteal cleft Anesthesia: local infiltration Local anesthetic: lidocaine 2% with epinephrine Anesthetic total: 22 ml Patient sedated: no Scalpel size: 11 Incision type: single straight Drainage: purulent Wound treatment: wound left open and drain placed Packing material: 1/2 in iodoform gauze Patient tolerance: Patient tolerated the procedure well with no immediate complications.   (including critical care time) Labs Review Labs Reviewed - No data to display Imaging Review No results found.  EKG Interpretation   None       MDM   1. Pilonidal cyst    PT with a pilonidal abscess. Moderate amount of purulent drainage.  Packed and discussed wound check in -3 days.  General surgery referral given. Discussed treatment plan with the patient. Return precautions given. Reports understanding and no other concerns at this time.  Patient is stable for discharge at this time. Meds given in ED:  Medications  HYDROcodone-acetaminophen (NORCO/VICODIN) 5-325 MG per tablet 1 tablet (1 tablet Oral Given 10/10/13 1327)    Discharge Medication List as of 10/10/2013  3:13 PM    START taking these medications   Details  amoxicillin-clavulanate (AUGMENTIN) 875-125 MG per tablet Take 1 tablet by mouth every 12 (twelve) hours., Starting 10/10/2013, Until Discontinued, Print    HYDROcodone-acetaminophen (NORCO/VICODIN) 5-325 MG per tablet Take 1 tablet by mouth every 4 (four) hours as needed., Starting 10/10/2013, Until Discontinued, Print    metroNIDAZOLE (FLAGYL) 500 MG tablet Take 1 tablet (500 mg total) by mouth 3 (three) times daily., Starting 10/10/2013, Until Discontinued, Print              Clabe Seal, PA-C 10/13/13 1301

## 2013-10-10 NOTE — ED Notes (Addendum)
Pt reports cyst/abcess to coccyx, worsening, now draining.  Pt c/o pain to site, 10/10. Appears very uncomfortable.  Onset x1 week ago.  Pt denies fevers/chills. Pt denies other complaints.  Approx 2" in length, swollen, with pencil eraser sized open area noted distally.  Otherwise skin PWD.  MAEI.

## 2013-10-11 ENCOUNTER — Encounter (HOSPITAL_COMMUNITY): Payer: Self-pay | Admitting: Emergency Medicine

## 2013-10-11 ENCOUNTER — Emergency Department (HOSPITAL_COMMUNITY)
Admission: EM | Admit: 2013-10-11 | Discharge: 2013-10-11 | Disposition: A | Discharge status: Home / Self Care | Payer: Managed Care, Other (non HMO) | Attending: Emergency Medicine | Admitting: Emergency Medicine

## 2013-10-11 DIAGNOSIS — Z09 Encounter for follow-up examination after completed treatment for conditions other than malignant neoplasm: Secondary | ICD-10-CM

## 2013-10-11 DIAGNOSIS — Z79899 Other long term (current) drug therapy: Secondary | ICD-10-CM | POA: Insufficient documentation

## 2013-10-11 DIAGNOSIS — Z4801 Encounter for change or removal of surgical wound dressing: Secondary | ICD-10-CM | POA: Insufficient documentation

## 2013-10-11 DIAGNOSIS — Z8781 Personal history of (healed) traumatic fracture: Secondary | ICD-10-CM | POA: Insufficient documentation

## 2013-10-11 DIAGNOSIS — F17200 Nicotine dependence, unspecified, uncomplicated: Secondary | ICD-10-CM | POA: Insufficient documentation

## 2013-10-11 NOTE — ED Provider Notes (Signed)
CSN: [id number]     Arrival date & time 10/11/13  1724 History  This chart was scribed for non-physician practitioner Trixie Dredge, PA-C working with Hurman Horn, MD by Joaquin Music, ED Scribe. This patient was seen in room WTR6/WTR6 and the patient's care was started at 6:16 PM .   Chief Complaint  Patient presents with  . Abscess   Patient is a 23 y.o. male presenting with abscess. The history is provided by the patient. No language interpreter was used.  Abscess Associated symptoms: no fever and no vomiting    HPI Comments: Nathaniel Richards is a 23 y.o. male who presents to the Emergency Department complaining of abscess. Pt states he was in the ED yesterday evening getting abscessed drained and states the packing of the wound fell out today. He is concerned he may get another abscess which is the reason for his visit. Pt states he is not in pain and states he does feel much better than yesterday. He states he has a rag over the area to soak up the discharge. Pt states he has not picked up his medications yet but will be doing that this evening. Pt denies fever, chills, and emesis.  Past Medical History  Diagnosis Date  . Fracture of metacarpal base of right hand, closed 05/15/2013    small finger   Past Surgical History  Procedure Laterality Date  . Soft tissue mass excision Left 05/28/2001    trapezius muscle  . Closed reduction finger with percutaneous pinning Right 05/24/2013    Procedure: RIGHT SMALL FINGER CLOSED REDUCTION  WITH PERCUTANEOUS PINNING;  Surgeon: [name], MD;  Location: [facility];  Service: Orthopedics;  Laterality: Right;   No family history on file. History  Substance Use Topics  . Smoking status: Current Every Day Smoker -- 4 years    Types: Cigarettes  . Smokeless tobacco: Never Used     Comment: 4-5 cig./day  . Alcohol Use: Yes     Comment: occasionally    Review of Systems  Constitutional: Negative for fever and chills.   Gastrointestinal: Negative for vomiting.  Skin: Positive for wound. Negative for color change.  Allergic/Immunologic: Negative for immunocompromised state.  Hematological: Does not bruise/bleed easily.    Allergies  Review of patient's allergies indicates no known allergies.  Home Medications   Current Outpatient Rx  Name  Route  Sig  Dispense  Refill  . acetaminophen (TYLENOL) 500 MG tablet   Oral   Take 1,500 mg by mouth once.         Marland Kitchen amoxicillin-clavulanate (AUGMENTIN) 875-125 MG per tablet   Oral   Take 1 tablet by mouth every 12 (twelve) hours.   14 tablet   0   . Benzocaine (BOIL EASE MAXIMUM STRENGTH) 20 % OINT   Apply externally   Apply 1 application topically 3 (three) times daily.         Marland Kitchen HYDROcodone-acetaminophen (NORCO/VICODIN) 5-325 MG per tablet   Oral   Take 1 tablet by mouth every 4 (four) hours as needed.   12 tablet   0   . metroNIDAZOLE (FLAGYL) 500 MG tablet   Oral   Take 1 tablet (500 mg total) by mouth 3 (three) times daily.   21 tablet   0    BP 144/97  Pulse 84  Temp(Src) 98.6 F (37 C) (Oral)  Resp 18  SpO2 99%  Physical Exam  Nursing note and vitals reviewed. Constitutional: He appears well-developed and  well-nourished. No distress.  HENT:  Head: Normocephalic and atraumatic.  Neck: Neck supple.  Pulmonary/Chest: Effort normal.  Neurological: He is alert.  Skin: He is not diaphoretic.  Incision on R side of pilonidal area. Tender to palpation. Clear and sanguinous discharge. No surrounding erythema, edema, warmth.  No surrounding induration.      ED Course  Procedures  DIAGNOSTIC STUDIES: Oxygen Saturation is 99% on RA, normal by my interpretation.    COORDINATION OF CARE: 6:19 PM-Discussed treatment plan which includes advised pt to continue taking medication as prescribed. Pt agreed to plan.   Labs Review Labs Reviewed - No data to display Imaging Review No results found.  EKG Interpretation   None       MDM   1. Encounter for recheck of abscess following incision and drainage    Pt presents for recheck of abscess after I&D performed yesterday.  Pt was concerned because the packing fell out early.  The area is open and draining clear/sanguinous fluid.  No overlying cellulitis.  Pt has not filled his prescriptions from yesterday and I encouraged him to do so.  Pt d/c home.  Discussed findings, treatment, and follow up  with patient.  Pt given return precautions.  Pt verbalizes understanding and agrees with plan.      I personally performed the services described in this documentation, which was scribed in my presence. The recorded information has been reviewed and is accurate.    Granite FallsEmily Maricela Kawahara, PA-C 10/11/13 1846

## 2013-10-11 NOTE — Discharge Instructions (Signed)
Read the information below.  You may return to the Emergency Department at any time for worsening condition or any new symptoms that concern you.  Please fill your prescriptions and take your medications as directed.  If you develop redness, swelling, uncontrolled pain, or fevers greater than 100.4, return to the ER immediately for a recheck.

## 2013-10-11 NOTE — ED Notes (Signed)
Pt states he had an abscess drained at [facility] yesterday. Pt changed the dressing today and the packing came out. Pt states the wound was still draining and that he used a washcloth to to wash the open wound.

## 2013-10-15 NOTE — ED Provider Notes (Signed)
Medical screening examination/treatment/procedure(s) were performed by non-physician practitioner and as supervising physician I was immediately available for consultation/collaboration.  Hurman HornJohn M Kimbely Whiteaker, MD 10/15/13 [phone]

## 2013-10-18 NOTE — ED Provider Notes (Signed)
Medical screening examination/treatment/procedure(s) were performed by non-physician practitioner and as supervising physician I was immediately available for consultation/collaboration.  EKG Interpretation   None         Wilbert Y. Wilmoth Rasnic, MD 10/18/13 1405 

## 2014-01-06 ENCOUNTER — Encounter (HOSPITAL_COMMUNITY): Payer: Self-pay | Admitting: Emergency Medicine

## 2014-01-06 ENCOUNTER — Emergency Department (HOSPITAL_COMMUNITY)
Admission: EM | Admit: 2014-01-06 | Discharge: 2014-01-06 | Disposition: A | Discharge status: Home / Self Care | Payer: Managed Care, Other (non HMO) | Attending: Emergency Medicine | Admitting: Emergency Medicine

## 2014-01-06 ENCOUNTER — Emergency Department (HOSPITAL_COMMUNITY): Payer: Managed Care, Other (non HMO)

## 2014-01-06 DIAGNOSIS — M25539 Pain in unspecified wrist: Secondary | ICD-10-CM

## 2014-01-06 DIAGNOSIS — Z8781 Personal history of (healed) traumatic fracture: Secondary | ICD-10-CM | POA: Insufficient documentation

## 2014-01-06 DIAGNOSIS — IMO0002 Reserved for concepts with insufficient information to code with codable children: Secondary | ICD-10-CM | POA: Insufficient documentation

## 2014-01-06 DIAGNOSIS — Z792 Long term (current) use of antibiotics: Secondary | ICD-10-CM | POA: Insufficient documentation

## 2014-01-06 DIAGNOSIS — Y9289 Other specified places as the place of occurrence of the external cause: Secondary | ICD-10-CM | POA: Insufficient documentation

## 2014-01-06 DIAGNOSIS — F17200 Nicotine dependence, unspecified, uncomplicated: Secondary | ICD-10-CM | POA: Insufficient documentation

## 2014-01-06 DIAGNOSIS — Z79899 Other long term (current) drug therapy: Secondary | ICD-10-CM | POA: Insufficient documentation

## 2014-01-06 DIAGNOSIS — Y990 Civilian activity done for income or pay: Secondary | ICD-10-CM | POA: Insufficient documentation

## 2014-01-06 DIAGNOSIS — R42 Dizziness and giddiness: Secondary | ICD-10-CM | POA: Insufficient documentation

## 2014-01-06 DIAGNOSIS — Y9389 Activity, other specified: Secondary | ICD-10-CM | POA: Insufficient documentation

## 2014-01-06 DIAGNOSIS — S0990XA Unspecified injury of head, initial encounter: Secondary | ICD-10-CM

## 2014-01-06 DIAGNOSIS — S6990XA Unspecified injury of unspecified wrist, hand and finger(s), initial encounter: Principal | ICD-10-CM

## 2014-01-06 DIAGNOSIS — S59909A Unspecified injury of unspecified elbow, initial encounter: Secondary | ICD-10-CM | POA: Insufficient documentation

## 2014-01-06 DIAGNOSIS — S59919A Unspecified injury of unspecified forearm, initial encounter: Principal | ICD-10-CM

## 2014-01-06 DIAGNOSIS — H538 Other visual disturbances: Secondary | ICD-10-CM | POA: Insufficient documentation

## 2014-01-06 DIAGNOSIS — W208XXA Other cause of strike by thrown, projected or falling object, initial encounter: Secondary | ICD-10-CM | POA: Insufficient documentation

## 2014-01-06 MED ORDER — IBUPROFEN 800 MG PO TABS: 800.0000 mg | ORAL_TABLET | Freq: Three times a day (TID) | ORAL | Status: DC

## 2014-01-06 NOTE — ED Notes (Signed)
Patient arrives from home with complaint of being struck by 3 large boxes of hard back textbooks which fell upon him from a heigh of approximately 7510ft. Boxes struck his head, mid-back, and right wrist.

## 2014-01-06 NOTE — ED Notes (Signed)
Patient transported to X-ray 

## 2014-01-06 NOTE — ED Provider Notes (Signed)
CSN: [id number]     Arrival date & time 01/06/14  2023 History  This chart was scribed for Nathaniel Richards Nathaniel Lamoureaux, PA, working with Flint MelterElliott L Wentz, MD, by Bronson CurbJacqueline Melvin, ED Scribe. This patient was seen in room TR10C/TR10C and the patient's care was started at 9:30 PM.    Chief Complaint  Patient presents with  . Arm Pain  . Back Pain    The history is provided by the patient. No language interpreter was used.    HPI Comments: Bard HerbertMichael Richards is a 23 y.o. male who presents to the Emergency Department with a chief complaint of moderate back pain and right arm pain that began today. 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 He states the boxes struck his head, mid-back, and right wrist.There is associated headaches, light-headedness, blurry vision, back pain, and right wrist pain. Patient denies LOC, nausea, and emesis.   Past Medical History  Diagnosis Date  . Fracture of metacarpal base of right hand, closed 05/15/2013    small finger   Past Surgical History  Procedure Laterality Date  . Soft tissue mass excision Left 05/28/2001    trapezius muscle  . Closed reduction finger with percutaneous pinning Right 05/24/2013    Procedure: RIGHT SMALL FINGER CLOSED REDUCTION  WITH PERCUTANEOUS PINNING;  Surgeon: Sharma CovertFred W Ortmann, MD;  Location: [facility];  Service: Orthopedics;  Laterality: Right;   History reviewed. No pertinent family history. History  Substance Use Topics  . Smoking status: Current Every Day Smoker -- 0.10 packs/day for 4 years    Types: Cigarettes  . Smokeless tobacco: Never Used     Comment: 4-5 cig./day  . Alcohol Use: No    Review of Systems  Eyes: Positive for visual disturbance.  Gastrointestinal: Negative for nausea and vomiting.  Musculoskeletal: Positive for back pain.       Right wrist pain.  Neurological: Positive for light-headedness and headaches.  Negative for syncope.      Allergies  Review of patient's allergies indicates no known allergies.  Home Medications   Prior to Admission medications   Medication Sig Start Date End Date Taking? Authorizing Provider  acetaminophen (TYLENOL) 500 MG tablet Take 1,500 mg by mouth once.    Historical Provider, MD  amoxicillin-clavulanate (AUGMENTIN) 875-125 MG per tablet Take 1 tablet by mouth every 12 (twelve) hours. 10/10/13   Lauren Doretha ImusM Parker, PA-C  Benzocaine (BOIL EASE MAXIMUM STRENGTH) 20 % OINT Apply 1 application topically 3 (three) times daily.    Historical Provider, MD  HYDROcodone-acetaminophen (NORCO/VICODIN) 5-325 MG per tablet Take 1 tablet by mouth every 4 (four) hours as needed. 10/10/13   Lauren Doretha ImusM Parker, PA-C  metroNIDAZOLE (FLAGYL) 500 MG tablet Take 1 tablet (500 mg total) by mouth 3 (three) times daily. 10/10/13   Clabe SealLauren M Parker, PA-C   Triage Vitals: BP 149/100  Pulse 105  Temp(Src) 97.9 F (36.6 C) (Oral)  Resp 20  Ht 6\' 1"  (1.854 m)  Wt 360 lb (163.295 kg)  BMI 47.51 kg/m2  SpO2 96%  Physical Exam  Nursing note and vitals reviewed. Constitutional: He is oriented to person, place, and time. He appears well-developed and well-nourished. No distress.  HENT:  Head: Normocephalic and atraumatic.  No scalp hematoma  Eyes: Conjunctivae and EOM are normal. Pupils are equal, round, and reactive to light. Right eye exhibits no discharge. Left eye exhibits no discharge. No scleral icterus.  Neck: Normal range of motion. Neck supple. No JVD present. No tracheal deviation present.  Cardiovascular: Normal rate, regular rhythm, normal heart sounds and intact distal pulses.  Exam reveals no gallop and no friction rub.   No murmur heard. Brisk capillary refill  Pulmonary/Chest: Effort normal and breath sounds normal. No respiratory distress. He has no wheezes. He has no rales. He exhibits no tenderness.  Abdominal: Soft. He exhibits no distension and no mass. There is no  tenderness. There is no rebound and no guarding.  Musculoskeletal: Normal range of motion. He exhibits no edema and no tenderness.  Right wrist moderately tender to palpation, range of motion strength is 5/5  No CTLS spine tenderness, step-off, or deformity  Neurological: He is alert and oriented to person, place, and time.  Sensation intact  Skin: Skin is warm and dry.  Psychiatric: He has a normal mood and affect. His behavior is normal. Judgment and thought content normal.    ED Course  Procedures (including critical care time)  DIAGNOSTIC STUDIES: Oxygen Saturation is 96% on room air, adequate by my interpretation.    COORDINATION OF CARE: At 2158 Discussed treatment plan with patient which includes wrist splint and ibuprofen. Patient agrees.     Imaging Review Dg Wrist Complete Right  01/06/2014   CLINICAL DATA:  Three boxes of books fell on the patient's right side, with medial right wrist pain, extending to the fourth and fifth digits.  EXAM: RIGHT WRIST - COMPLETE 3+ VIEW  COMPARISON:  Right hand radiographs from 05/15/2013  FINDINGS: There is no evidence of fracture or dislocation. The carpal rows are intact, and demonstrate normal alignment. The joint spaces are preserved. Negative ulnar variance is noted.  No significant soft tissue abnormalities are seen.  IMPRESSION: No evidence of fracture or dislocation.   Electronically Signed   By: Roanna RaiderJeffery  Chang M.D.   On: 01/06/2014 21:50     MDM   Final diagnoses:  Wrist pain  Head injury    Wrist sprain after box of books fell on head and wrist.  Plain films are negative.  DC to home with PCP follow-up.  I personally performed the services described in this documentation, which was scribed in my presence. The recorded information has been reviewed and is accurate.     Nathaniel Richards [name], PA-C 01/07/14 [phone]

## 2014-01-06 NOTE — Discharge Instructions (Signed)
Concussion, Adult A concussion is a brain injury. It is caused by:  A hit to the head.  A quick and sudden movement (jolt) of the head or neck. A concussion is usually not life-threatening. Even so, it can cause serious problems. If you had a concussion before, you may have concussion-like problems after a hit to your head. HOME CARE General Instructions  Follow your doctor's directions carefully.  Take medicines only as told by your doctor.  Only take medicines your doctor says are safe.  Do not drink alcohol until your doctor says it is OK. Alcohol and some drugs can slow down healing. They can also put you at risk for further injury.  If your are having trouble remembering things, write them down.  Try to do one thing at a time if you get distracted easily. For example, do not watch TV while making dinner.  Talk to your family members or close friends when making important decisions.  Follow up with your doctor as told.  Watch your symptoms. Tell others to do the same. Serious problems can sometimes happen after a concussion. Older adults are more likely to have these problems.  Tell your teachers, school nurse, school counselor, coach, Product/process development scientist, or work Freight forwarder about your concussion. Tell them about what you can or cannot do. They should watch to see if:  It gets even harder for you to pay attention or concentrate.  It gets even harder for you to remember things or learn new things.  You need more time than normal to finish things.  You become annoyed (irritable) more than before.  You are not able to deal with stress as well.  You have more problems than before.  Rest. Make sure you:  Get plenty of sleep at night.  Go to sleep early.  Go to bed at the same time every day. Try to wake up at the same time.  Rest during the day.  Take naps when you feel tired.  Limit activities where you have to think a lot or concentrate. These include:  Doing  homework.  Doing work related to a job.  Watching TV.  Using the computer. Returning To Your Regular Activities Return to your normal activities slowly, not all at once. You must give your body and brain enough time to heal.   Do not play sports or do other athletic activities until your doctor says it is OK.  Ask your doctor when you can drive, ride a bicycle, or work other vehicles or machines. Never do these things if you feel dizzy.  Ask your doctor about when you can return to work or school. Preventing Another Concussion It is very important to avoid another brain injury, especially before you have healed. In rare cases, another injury can lead to permanent brain damage, brain swelling, or death. The risk of this is greatest during the first 7 10 after your injury. Avoid injuries by:   Wearing a seat belt when riding in a car.  Not drinking too much alcohol.  Avoiding activities that could lead to a second concussion (such as contact sports).  Wearing a helmet when doing activities like:  Biking.  Skiing.  Skateboarding.  Skating.  Making your home safer by:  Removing things from the floor or stairways that could make you trip.  Using grab bars in bathrooms and handrails by stairs.  Placing non-slip mats on floors and in bathtubs.  Improve lighting in dark areas. GET HELP IF:  It  gets even harder for you to pay attention or concentrate.  It gets even harder for you to remember things or learn new things.  You need more time than normal to finish things.  You become annoyed (irritable) more than before.  You are not able to deal with stress as well.  You have more problems than before.  You have problems keeping your balance.  You are not able to react quickly when you should. Get help if you have any of these problems for more than 2 weeks:   Lasting (chronic) headaches.  Dizziness or trouble balancing.  Feeling sick to your stomach  (nausea).  Seeing (vision) problems.  Being affected by noises or light more than normal.  Feeling sad, low, down in the dumps, blue, gloomy, or empty (depressed).  Mood changes (mood swings).  Feeling of fear or nervousness about what may happen (anxiety).  Feeling annoyed.  Memory problems.  Problems concentrating or paying attention.  Sleep problems.  Feeling tired all the time. GET HELP RIGHT AWAY IF:   You have bad headaches or your headaches get worse.  You have weakness (even if it is in one hand, leg, or part of the face).  You have loss of feeling (numbness).  You feel off balance.  You keep throwing up (vomiting).  You feel tired.  One black center of your eye (pupil) is larger than the other.  You twitch or shake violently (convulse).  Your speech is not clear (slurred).  You are more confused, easily angered (agitated), or annoyed than before.  You have more trouble resting than before.  You are unable to recognize people or places.  You have neck pain.  It is difficult to wake you up.  You have unusual behavior changes.  You pass out (lose consciousness). MAKE SURE YOU:   Understand these instructions.  Will watch your condition.  Will get help right away if you are not doing well or get worse. Document Released: 08/14/2009 Document Revised: 06/16/2013 Document Reviewed: 03/18/2013 [facility] Patient Information 2014 [place], [place].  Arthralgia Your caregiver has diagnosed you as suffering from an arthralgia. Arthralgia means there is pain in a joint. This can come from many reasons including:  Bruising the joint which causes soreness (inflammation) in the joint.  Wear and tear on the joints which occur as we grow older (osteoarthritis).  Overusing the joint.  Various forms of arthritis.  Infections of the joint. Regardless of the cause of pain in your joint, most of these different pains respond to anti-inflammatory drugs and  rest. The exception to this is when a joint is infected, and these cases are treated with antibiotics, if it is a bacterial infection. HOME CARE INSTRUCTIONS   Rest the injured area for as long as directed by your caregiver. Then slowly start using the joint as directed by your caregiver and as the pain allows. Crutches as directed may be useful if the ankles, knees or hips are involved. If the knee was splinted or casted, continue use and care as directed. If an stretchy or elastic wrapping bandage has been applied today, it should be removed and re-applied every 3 to 4 hours. It should not be applied tightly, but firmly enough to keep swelling down. Watch toes and feet for swelling, bluish discoloration, coldness, numbness or excessive pain. If any of these problems (symptoms) occur, remove the ace bandage and re-apply more loosely. If these symptoms persist, contact your caregiver or return to this location.  For  the first 24 hours, keep the injured extremity elevated on pillows while lying down.  Apply ice for 15-20 minutes to the sore joint every couple hours while awake for the first half day. Then 03-04 times per day for the first 48 hours. Put the ice in a plastic bag and place a towel between the bag of ice and your skin.  Wear any splinting, casting, elastic bandage applications, or slings as instructed.  Only take over-the-counter or prescription medicines for pain, discomfort, or fever as directed by your caregiver. Do not use aspirin immediately after the injury unless instructed by your physician. Aspirin can cause increased bleeding and bruising of the tissues.  If you were given crutches, continue to use them as instructed and do not resume weight bearing on the sore joint until instructed. Persistent pain and inability to use the sore joint as directed for more than 2 to 3 days are warning signs indicating that you should see a caregiver for a follow-up visit as soon as possible.  Initially, a hairline fracture (break in bone) may not be evident on X-rays. Persistent pain and swelling indicate that further evaluation, non-weight bearing or use of the joint (use of crutches or slings as instructed), or further X-rays are indicated. X-rays may sometimes not show a small fracture until a week or 10 days later. Make a follow-up appointment with your own caregiver or one to whom we have referred you. A radiologist (specialist in reading X-rays) may read your X-rays. Make sure you know how you are to obtain your X-ray results. Do not assume everything is normal if you do not hear from us. SEEK MEDICAL CARE IF: Bruising, swelling, or pain increases. SEEK IMMEDIATE MEDICAL CARE IF:   Your fingers or toes are numb or blue.  The pain is not responding to medications and continues to stay the same or get worse.  The pain in your joint becomes severe.  You develop a fever over 102 F (38.9 C).  It becomes impossible to move or use the joint. MAKE SURE YOU:   Understand these instructions.  Will watch your condition.  Will get help right away if you are not doing well or get worse. Document Released: 08/26/2005 Document Revised: 11/18/2011 Document Reviewed: 04/13/2008 [facility] Patient Information 2014 [place], [place]. Wrist Sprain with Rehab A sprain is an injury in which a ligament that maintains the proper alignment of a joint is partially or completely torn. The ligaments of the wrist are susceptible to sprains. Sprains are classified into three categories. Grade 1 sprains cause pain, but the tendon is not lengthened. Grade 2 sprains include a lengthened ligament because the ligament is stretched or partially ruptured. With grade 2 sprains there is still function, although the function may be diminished. Grade 3 sprains are characterized by a complete tear of the tendon or muscle, and function is usually impaired. SYMPTOMS   Pain tenderness, inflammation, and/or bruising  (contusion) of the injury.  A "pop" or tear felt and/or heard at the time of injury.  Decreased wrist function. CAUSES  A wrist sprain occurs when a force is placed on one or more ligaments that is greater than it/they can withstand. Common mechanisms of injury include:  Catching a ball with you hands.  Repetitive and/ or strenuous extension or flexion of the wrist. RISK INCREASES WITH:  Previous wrist injury.  Contact sports (boxing or wrestling).  Activities in which falling is common.  Poor strength and flexibility.  Improperly fitted or padded  protective equipment. PREVENTION  Warm up and stretch properly before activity.  Allow for adequate recovery between workouts.  Maintain physical fitness:  Strength, flexibility, and endurance.  Cardiovascular fitness.  Protect the wrist joint by limiting its motion with the use of taping, braces, or splints.  Protect the wrist after injury for 6 to 12 months. PROGNOSIS  The prognosis for wrist sprains depends on the degree of injury. Grade 1 sprains require 2 to 6 weeks of treatment. Grade 2 sprains require 6 to 8 weeks of treatment, and grade 3 sprains require up to 12 weeks.  RELATED COMPLICATIONS   Prolonged healing time, if improperly treated or re-injured.  Recurrent symptoms that result in a chronic problem.  Injury to nearby structures (bone, cartilage, nerves, or tendons).  Arthritis of the wrist.  Inability to compete in athletics at a high level.  Wrist stiffness or weakness.  Progression to a complete rupture of the ligament. TREATMENT  Treatment initially involves resting from any activities that aggravate the symptoms, and the use of ice and medications to help reduce pain and inflammation. Your caregiver may recommend immobilizing the wrist for a period of time in order to reduce stress on the ligament and allow for healing. After immobilization it is important to perform strengthening and stretching  exercises to help regain strength and a full range of motion. These exercises may be completed at home or with a therapist. Surgery is not usually required for wrist sprains, unless the ligament has been ruptured (grade 3 sprain). MEDICATION   If pain medication is necessary, then nonsteroidal anti-inflammatory medications, such as aspirin and ibuprofen, or other minor pain relievers, such as acetaminophen, are often recommended.  Do not take pain medication for 7 days before surgery.  Prescription pain relievers may be given if deemed necessary by your caregiver. Use only as directed and only as much as you need. HEAT AND COLD  Cold treatment (icing) relieves pain and reduces inflammation. Cold treatment should be applied for 10 to 15 minutes every 2 to 3 hours for inflammation and pain and immediately after any activity that aggravates your symptoms. Use ice packs or massage the area with a piece of ice (ice massage).  Heat treatment may be used prior to performing the stretching and strengthening activities prescribed by your caregiver, physical therapist, or athletic trainer. Use a heat pack or soak your injury in warm water. SEEK MEDICAL CARE IF:  Treatment seems to offer no benefit, or the condition worsens.  Any medications produce adverse side effects. EXERCISES RANGE OF MOTION (ROM) AND STRETCHING EXERCISES - Wrist Sprain  These exercises may help you when beginning to rehabilitate your injury. Your symptoms may resolve with or without further involvement from your physician, physical therapist or athletic trainer. While completing these exercises, remember:   Restoring tissue flexibility helps normal motion to return to the joints. This allows healthier, less painful movement and activity.  An effective stretch should be held for at least 30 seconds.  A stretch should never be painful. You should only feel a gentle lengthening or release in the stretched tissue. RANGE OF MOTION   Wrist Flexion, Active-Assisted  Extend your right / left elbow with your fingers pointing down.*  Gently pull the back of your hand towards you until you feel a gentle stretch on the top of your forearm.  Hold this position for __________ seconds. Repeat __________ times. Complete this exercise __________ times per day.  *If directed by your physician, physical therapist or  athletic trainer, complete this stretch with your elbow bent rather than extended. RANGE OF MOTION  Wrist Extension, Active-Assisted  Extend your right / left elbow and turn your palm upwards.*  Gently pull your palm/fingertips back so your wrist extends and your fingers point more toward the ground.  You should feel a gentle stretch on the inside of your forearm.  Hold this position for __________ seconds. Repeat __________ times. Complete this exercise __________ times per day. *If directed by your physician, physical therapist or athletic trainer, complete this stretch with your elbow bent, rather than extended. RANGE OF MOTION  Supination, Active  Stand or sit with your elbows at your side. Bend your right / left elbow to 90 degrees.  Turn your palm upward until you feel a gentle stretch on the inside of your forearm.  Hold this position for __________ seconds. Slowly release and return to the starting position. Repeat __________ times. Complete this stretch __________ times per day.  RANGE OF MOTION  Pronation, Active  Stand or sit with your elbows at your side. Bend your right / left elbow to 90 degrees.  Turn your palm downward until you feel a gentle stretch on the top of your forearm.  Hold this position for __________ seconds. Slowly release and return to the starting position. Repeat __________ times. Complete this stretch __________ times per day.  STRETCH - Wrist Flexion  Place the back of your right / left hand on a tabletop leaving your elbow slightly bent. Your fingers should point away from  your body.  Gently press the back of your hand down onto the table by straightening your elbow. You should feel a stretch on the top of your forearm.  Hold this position for __________ seconds. Repeat __________ times. Complete this stretch __________ times per day.  STRETCH  Wrist Extension  Place your right / left fingertips on a tabletop leaving your elbow slightly bent. Your fingers should point backwards.  Gently press your fingers and palm down onto the table by straightening your elbow. You should feel a stretch on the inside of your forearm.  Hold this position for __________ seconds. Repeat __________ times. Complete this stretch __________ times per day.  STRENGTHENING EXERCISES - Wrist Sprain These exercises may help you when beginning to rehabilitate your injury. They may resolve your symptoms with or without further involvement from your physician, physical therapist or athletic trainer. While completing these exercises, remember:   Muscles can gain both the endurance and the strength needed for everyday activities through controlled exercises.  Complete these exercises as instructed by your physician, physical therapist or athletic trainer. Progress with the resistance and repetition exercises only as your caregiver advises. STRENGTH Wrist Flexors  Sit with your right / left forearm palm-up and fully supported. Your elbow should be resting below the height of your shoulder. Allow your wrist to extend over the edge of the surface.  Loosely holding a __________ weight or a piece of rubber exercise band/tubing, slowly curl your hand up toward your forearm.  Hold this position for __________ seconds. Slowly lower the wrist back to the starting position in a controlled manner. Repeat __________ times. Complete this exercise __________ times per day.  STRENGTH  Wrist Extensors  Sit with your right / left forearm palm-down and fully supported. Your elbow should be resting below  the height of your shoulder. Allow your wrist to extend over the edge of the surface.  Loosely holding a __________ weight or a piece of  rubber exercise band/tubing, slowly curl your hand up toward your forearm.  Hold this position for __________ seconds. Slowly lower the wrist back to the starting position in a controlled manner. Repeat __________ times. Complete this exercise __________ times per day.  STRENGTH - Ulnar Deviators  Stand with a ____________________ weight in your right / left hand, or sit holding on to the rubber exercise band/tubing with your opposite arm supported.  Move your wrist so that your pinkie travels toward your forearm and your thumb moves away from your forearm.  Hold this position for __________ seconds and then slowly lower the wrist back to the starting position. Repeat __________ times. Complete this exercise __________ times per day STRENGTH - Radial Deviators  Stand with a ____________________ weight in your  right / left hand, or sit holding on to the rubber exercise band/tubing with your arm supported.  Raise your hand upward in front of you or pull up on the rubber tubing.  Hold this position for __________ seconds and then slowly lower the wrist back to the starting position. Repeat __________ times. Complete this exercise __________ times per day. STRENGTH  Forearm Supinators  Sit with your right / left forearm supported on a table, keeping your elbow below shoulder height. Rest your hand over the edge, palm down.  Gently grip a hammer or a soup ladle.  Without moving your elbow, slowly turn your palm and hand upward to a "thumbs-up" position.  Hold this position for __________ seconds. Slowly return to the starting position. Repeat __________ times. Complete this exercise __________ times per day.  STRENGTH  Forearm Pronators  Sit with your right / left forearm supported on a table, keeping your elbow below shoulder height. Rest your hand  over the edge, palm up.  Gently grip a hammer or a soup ladle.  Without moving your elbow, slowly turn your palm and hand upward to a "thumbs-up" position.  Hold this position for __________ seconds. Slowly return to the starting position. Repeat __________ times. Complete this exercise __________ times per day.  STRENGTH - Grip  Grasp a tennis ball, a dense sponge, or a large, rolled sock in your hand.  Squeeze as hard as you can without increasing any pain.  Hold this position for __________ seconds. Release your grip slowly. Repeat __________ times. Complete this exercise __________ times per day.  Document Released: 08/26/2005 Document Revised: 11/18/2011 Document Reviewed: 12/08/2008 [facility] Patient Information 2014 [place], [place].

## 2014-01-07 NOTE — ED Provider Notes (Signed)
Medical screening examination/treatment/procedure(s) were performed by non-physician practitioner and as supervising physician I was immediately available for consultation/collaboration.   EKG Interpretation None       Flint MelterElliott L Giacomo Valone, MD 01/07/14 1031

## 2014-01-19 ENCOUNTER — Encounter (HOSPITAL_COMMUNITY): Payer: Self-pay | Admitting: Emergency Medicine

## 2014-01-19 ENCOUNTER — Emergency Department (HOSPITAL_COMMUNITY)
Admission: EM | Admit: 2014-01-19 | Discharge: 2014-01-19 | Disposition: A | Discharge status: Home / Self Care | Payer: Managed Care, Other (non HMO) | Attending: Emergency Medicine | Admitting: Emergency Medicine

## 2014-01-19 DIAGNOSIS — S298XXA Other specified injuries of thorax, initial encounter: Secondary | ICD-10-CM | POA: Insufficient documentation

## 2014-01-19 DIAGNOSIS — Z791 Long term (current) use of non-steroidal anti-inflammatories (NSAID): Secondary | ICD-10-CM | POA: Insufficient documentation

## 2014-01-19 DIAGNOSIS — Z8781 Personal history of (healed) traumatic fracture: Secondary | ICD-10-CM | POA: Insufficient documentation

## 2014-01-19 DIAGNOSIS — Y929 Unspecified place or not applicable: Secondary | ICD-10-CM | POA: Insufficient documentation

## 2014-01-19 DIAGNOSIS — F17200 Nicotine dependence, unspecified, uncomplicated: Secondary | ICD-10-CM | POA: Insufficient documentation

## 2014-01-19 DIAGNOSIS — Y9389 Activity, other specified: Secondary | ICD-10-CM | POA: Insufficient documentation

## 2014-01-19 DIAGNOSIS — X500XXA Overexertion from strenuous movement or load, initial encounter: Secondary | ICD-10-CM | POA: Insufficient documentation

## 2014-01-19 DIAGNOSIS — S239XXA Sprain of unspecified parts of thorax, initial encounter: Secondary | ICD-10-CM | POA: Insufficient documentation

## 2014-01-19 DIAGNOSIS — T148XXA Other injury of unspecified body region, initial encounter: Secondary | ICD-10-CM

## 2014-01-19 MED ORDER — NAPROXEN 500 MG PO TABS: 500.0000 mg | ORAL_TABLET | Freq: Two times a day (BID) | ORAL | Status: DC

## 2014-01-19 MED ORDER — CYCLOBENZAPRINE HCL 10 MG PO TABS: 10.0000 mg | ORAL_TABLET | Freq: Two times a day (BID) | ORAL | Status: DC | PRN

## 2014-01-19 NOTE — ED Provider Notes (Signed)
CSN: [id number]     Arrival date & time 01/19/14  1048 History  This chart was scribed for non-physician practitioner working with Rolland PorterMark James, MD, by Jarvis Morganaylor Ferguson, ED Scribe. This patient was seen in room TR05C/TR05C and the patient's care was started at 12:09 PM.    Chief Complaint  Patient presents with  . Back Pain      Patient is a 23 y.o. male presenting with back pain. The history is provided by the patient. No language interpreter was used.  Back Pain Location:  Thoracic spine Quality:  Shooting Radiates to: chest. Pain severity:  Moderate Onset quality:  Sudden Duration:  3 hours Timing:  Constant Progression:  Unchanged Chronicity:  New Context: lifting heavy objects   Relieved by:  None tried Worsened by:  Nothing tried  HPI Comments: Bard HerbertMichael Colwell is a 23 y.o. male who presents to the Emergency Department complaining of moderate, constant, shooting back pain onset this morning. Patient states that he was picking up his son this morning when he feels like he pulled a muscle. Patient states that the pain is localized in central upper back and radiates to his chest. Patient states that the pain is exacerbated with inspiration, moving, and twisting. Patient states that he had an incident two weeks ago at work where a stack of textbooks fell on him and caused the initial injury to his back.  No PCP   Past Medical History  Diagnosis Date  . Fracture of metacarpal base of right hand, closed 05/15/2013    small finger   Past Surgical History  Procedure Laterality Date  . Soft tissue mass excision Left 05/28/2001    trapezius muscle  . Closed reduction finger with percutaneous pinning Right 05/24/2013    Procedure: RIGHT SMALL FINGER CLOSED REDUCTION  WITH PERCUTANEOUS PINNING;  Surgeon: Sharma CovertFred W Ortmann, MD;  Location: [facility];  Service: Orthopedics;  Laterality: Right;   History reviewed. No pertinent family history. History  Substance Use Topics  .  Smoking status: Current Every Day Smoker -- 0.10 packs/day for 4 years    Types: Cigarettes  . Smokeless tobacco: Never Used     Comment: 4-5 cig./day  . Alcohol Use: No    Review of Systems  Musculoskeletal: Positive for back pain.  All other systems reviewed and are negative.     Allergies  Review of patient's allergies indicates no known allergies.  Home Medications   Prior to Admission medications   Medication Sig Start Date End Date Taking? Authorizing Provider  ibuprofen (ADVIL,MOTRIN) 800 MG tablet Take 1 tablet (800 mg total) by mouth 3 (three) times daily. 01/06/14   Roxy Horsemanobert Browning, PA-C   There were no vitals taken for this visit.  Physical Exam  Nursing note and vitals reviewed. Constitutional: He is oriented to person, place, and time. He appears well-developed and well-nourished. No distress.  HENT:  Head: Normocephalic and atraumatic.  Right Ear: External ear normal.  Left Ear: External ear normal.  Nose: Nose normal.  Mouth/Throat: Oropharynx is clear and moist.  Eyes: Conjunctivae are normal.  Neck: Normal range of motion. Neck supple.  Cardiovascular: Normal rate.   Pulmonary/Chest: Effort normal.  Abdominal: Soft.  Musculoskeletal: Normal range of motion.  Right upper back and right anterior chest tenderness to palpation. No midline spine tenderness  Neurological: He is alert and oriented to person, place, and time.  Skin: Skin is warm and dry. He is not diaphoretic.  Psychiatric: He has a normal mood and  affect.    ED Course  Procedures (including critical care time)   COORDINATION OF CARE: 12:14 PM- Will discharge patient with Flexeril and Naprosyn.  Pt advised of plan for treatment and pt agrees.    Labs Review Labs Reviewed - No data to display  Imaging Review No results found.   EKG Interpretation None      MDM   Final diagnoses:  Muscle strain    Patient has a muscle strain from picking up his son this morning. No blunt  trauma. No imaging required. Patient will have Flexeril for muscle spasm. Vitals stable and patient afebrile.   I personally performed the services described in this documentation, which was scribed in my presence. The recorded information has been reviewed and is accurate.     Emilia BeckKaitlyn Erland Vivas, [place] JerseyPA-C 01/20/14 [phone]

## 2014-01-19 NOTE — ED Notes (Signed)
Pt states he pulled a muscle this morning picking up his son in upper back; radiates to chest. States it is worse with inspiration. Moving, turning around.

## 2014-01-19 NOTE — Discharge Instructions (Signed)
Take Naprosyn as needed for pain. Take Flexeril as needed for muscle spasm. You may take these medications together. Refer to attached documents for more information. Follow up with a primary care provider from the resource guide below.    Emergency Department Resource Guide 1) Find a Doctor and Pay Out of Pocket Although you won't have to find out who is covered by your insurance plan, it is a good idea to ask around and get recommendations. You will then need to call the office and see if the doctor you have chosen will accept you as a new patient and what types of options they offer for patients who are self-pay. Some doctors offer discounts or will set up payment plans for their patients who do not have insurance, but you will need to ask so you aren't surprised when you get to your appointment.  2) Contact Your Local Health Department Not all health departments have doctors that can see patients for sick visits, but many do, so it is worth a call to see if yours does. If you don't know where your local health department is, you can check in your phone book. The CDC also has a tool to help you locate your state's health department, and many state websites also have listings of all of their local health departments.  3) Find a Walk-in Clinic If your illness is not likely to be very severe or complicated, you may want to try a walk in clinic. These are popping up all over the country in pharmacies, drugstores, and shopping centers. They're usually staffed by nurse practitioners or physician assistants that have been trained to treat common illnesses and complaints. They're usually fairly quick and inexpensive. However, if you have serious medical issues or chronic medical problems, these are probably not your best option.  No Primary Care Doctor: - Call Health Connect at  [phone] - they can help you locate a primary care doctor that  accepts your insurance, provides certain services,  etc. - Physician Referral Service- [phone]  Chronic Pain Problems: Organization         Address  Phone   Notes  [facility]  [phone] Patients need to be referred by their primary care doctor.   Medication Assistance: Organization         Address  Phone   Notes  [facility] Medication [facility] [address] [place], [place] [address] [phone] --Must be a resident of [place] -- Must have NO insurance coverage whatsoever (no Medicaid/ Medicare, etc.) -- The pt. MUST have a primary care doctor that directs their care regularly and follows them in the community   MedAssist  [phone]   [employer]  [phone]    Agencies that provide inexpensive medical care: Organization         Address  Phone   Notes  [facility]  [phone]   [facility]    [phone]   [facility] [address] [place], [place] [address] [phone]   [facility] [address] [place], [place] [phone]   Planned Parenthood    [phone]   [facility]    [phone]   [facility]  [address], [place] Phone:  [phone], Fax:  [phone] Hours of Operation:  9 am - 6 pm, M-F.  Also accepts Medicaid/Medicare and self-pay.  [facility] [address], [address], [place] Phone: [phone], Fax: [phone]. Hours of Operation:  8:30 am - 5:30 pm, M-F.  Also accepts Medicaid and self-pay.  [facility] [address] [place] Phone: [phone]   [place], [place], [place] [phone], Ext. [phone] Mondays & Thursdays: 7-9 AM.  First 15 patients are seen on a first come, first serve basis.    [place] Providers:  Organization         Address  Phone   Notes  [facility] [address], [place] [phone] Also accepts self-pay patients.  [facility] [address], [place]  [phone]   [place], [address], [place] [phone]   [facility] [address], [place] [phone]   Lucianne Lei [address], [place]   [phone] Only accepts [place] Access [place] patients after they have their name applied to their card.   Self-Pay (no insurance) in [facility] [place]:  Organization         Address  Phone   Notes  Sickle Cell Patients, [facility] [place] [phone]   [facility] [place] [phone]   [facility]  [place], [place], [place] [phone]   [facility]/Dr. Osei-Bonsu  [address] [place] or [address], [place] [phone] Phone number for both [place] and [place] locations is the same.  Urgent Medical and [facility] [address], [place] [phone]   [facility] [address], [place] or [address] [phone]   [facility] [address] [place] [phone], phone; [phone], fax Sees patients 1st and 3rd Saturday of every month.  Must not qualify for public or private insurance (i.e. Medicaid, Medicare, [place] Health Choice, Veterans' Benefits)  Household income should be no more than 200% of the poverty level The clinic cannot treat you if you are pregnant or think you are pregnant  Sexually transmitted diseases are not treated at the clinic.    Dental Care: Organization         Address  Phone  Notes  [facility] Department of [facility] [place] [phone] Accepts children up to  age 78 who are enrolled in [place] or [place]; pregnant women with a Medicaid card; and children who have applied for Medicaid or [place] Health Choice, but were declined, whose parents can pay a reduced fee at time of service.  [facility] Department of [facility]  [address], [place] [phone] Accepts children up to age 39 who are enrolled in [place] or [place]; pregnant women with a Medicaid card; and children who have applied for Medicaid or [place] Health Choice, but were declined, whose parents can pay a reduced fee at time of service.  [facility] Adult Dental Access PROGRAM  [place] [phone] Patients are seen by appointment only. Walk-ins are not accepted. [place] will see patients 78 years of age and older. Monday - Tuesday (8am-5pm) Most Wednesdays (8:30-5pm) $30 per  visit, cash only  [facility] Adult [employer] PROGRAM  [address], [facility] [phone] Patients are seen by appointment only. Walk-ins are not accepted. [place] will see patients 25 years of age and older. One Wednesday Evening (Monthly: Volunteer Based).  $30 per visit, cash only  [place]  [phone] for adults; Children under age 88, call Graduate Pediatric Dentistry at [phone]. Children aged 53-14, please call [phone] to request a pediatric application.  Dental services are provided in all areas of dental care including fillings, crowns and bridges, complete and partial dentures, implants, gum treatment, root canals, and extractions. Preventive care is also provided. Treatment is provided to both adults and children. Patients are selected via a lottery and there is often a waiting list.   [facility] [address] [place]  [phone] [email or web address]   [facility] [address] [place], [place] [phone], Ext. [phone] Second and Fourth Thursday of  each month, opens at 6:30 AM; Clinic ends at 9 AM.  Patients are seen on a first-come first-served basis, and a limited number are seen during each clinic.   [facility]  [address] Hillard Danker [place], [place] [phone]   Eligibility Requirements You must have lived in [place], [place], or [place] counties for at least the last three months.   You cannot be eligible for state or federal sponsored [employer], including [employer], [place], or [employer].   You generally cannot be eligible for healthcare insurance through your employer.    How to apply: Eligibility screenings are held every Tuesday and Wednesday afternoon from 1:00 pm until 4:00 pm. You do not need an appointment for the interview!  [facility] [address], [place], [place]   [place]  [place] Department  [place]  [phone]    Behavioral Health Resources in the Community: Intensive Outpatient Programs Organization         Address  Phone  Notes  [place]. [address] [place], [place] [phone]   [facility] Outpatient [address] [place], [place]   ADS: Alcohol & Drug Svcs [address] [place], [place]   [place] [address],  [place], [place] or [phone]   Substance Abuse Resources Organization         Address  Phone  Notes  Alcohol and Drug Services  [phone]   [place]  [phone]   [facility] [place]   Chinita Pester  [phone]   Residential & Outpatient Substance Abuse Program  [phone]   Psychological Services Organization         Address  Phone  Notes  [facility]  [place]  [phone]   [place] [address],  [place] or [phone]    Mobile Crisis Teams Organization         Address  Phone  Notes  [facility], Mobile Crisis Care Unit  [phone]   [facility]  [address] [place], [place]   [place] [address], [place] [phone]    Self-Help/Support Groups Organization         Address  Phone  Notes  [facility]oc[facility] of [place] - variety of support groups  [place] Call for more information  Narcotics Anonymous (NA), [facility]aring [facility] [address], [employer] [place]  2 meetings at this location   Special educational needs teacher         Address  Phone  Notes  ASAP Residential Treatment [place],    [place]  [phone]   [facility]  [address], [place] [phone], [place], [place]   [place], [place] [phone] Admissions: 8am-3pm M-F  Incentives Substance [place] [address],    [place], [place] [phone]   [facility] [address] [place], [place], [place]   [facility] [address]  [place], [place]   [facility] - Intensive Outpatient [address], [place], [place]   [facility] ([place].,  [place], [place] [phone] or [phone]   [facility] Services (RTS) [address] [place], [place] Accepts Medicaid  [facility] [place] [address],  [place] [phone] Substance Abuse/Addiction Treatment   [facility] Organization         Address  Phone  Notes  [facility]  [phone]   Domenic Schwab, PhD [address] Arlis Porta [place], [place]   [phone] or [phone]   [place], [place] [phone]     [facility] [address], [place], [place] [phone] Insurance/Medicaid/sponsorship through [facility] and Families [address], [address]                                    [place], [place] [phone] [place] [address], [place] [phone]    Dr. Adele Schilder  [phone]   [facility] Dept. 1) [address], [place] 2) [place] 3)  [address], [place] [phone]  [phone]   [place] [phone] or [phone] (After Hours)

## 2014-01-27 NOTE — ED Provider Notes (Signed)
Medical screening examination/treatment/procedure(s) were performed by non-physician practitioner and as supervising physician I was immediately available for consultation/collaboration.   EKG Interpretation None        Liseth Wann, MD 01/27/14 0028 

## 2014-09-07 ENCOUNTER — Encounter (HOSPITAL_COMMUNITY): Payer: Self-pay | Admitting: Emergency Medicine

## 2014-09-07 ENCOUNTER — Emergency Department (HOSPITAL_COMMUNITY)
Admission: EM | Admit: 2014-09-07 | Discharge: 2014-09-07 | Disposition: A | Discharge status: Home / Self Care | Payer: Managed Care, Other (non HMO) | Attending: Emergency Medicine | Admitting: Emergency Medicine

## 2014-09-07 DIAGNOSIS — L0501 Pilonidal cyst with abscess: Secondary | ICD-10-CM | POA: Insufficient documentation

## 2014-09-07 DIAGNOSIS — Z720 Tobacco use: Secondary | ICD-10-CM | POA: Diagnosis not present

## 2014-09-07 DIAGNOSIS — Z8781 Personal history of (healed) traumatic fracture: Secondary | ICD-10-CM | POA: Diagnosis not present

## 2014-09-07 DIAGNOSIS — Z791 Long term (current) use of non-steroidal anti-inflammatories (NSAID): Secondary | ICD-10-CM | POA: Insufficient documentation

## 2014-09-07 DIAGNOSIS — L089 Local infection of the skin and subcutaneous tissue, unspecified: Secondary | ICD-10-CM | POA: Diagnosis present

## 2014-09-07 MED ORDER — HYDROCODONE-ACETAMINOPHEN 5-325 MG PO TABS: 1.0000 | ORAL_TABLET | ORAL | Status: DC | PRN

## 2014-09-07 MED ORDER — LIDOCAINE-EPINEPHRINE (PF) 2 %-1:200000 IJ SOLN
20.0000 mL | Freq: Once | INTRAMUSCULAR | Status: AC
  Administered 2014-09-07: 20 mL
  Filled 2014-09-07: qty 20

## 2014-09-07 NOTE — Discharge Instructions (Signed)
Return in 2 days for recheck and packing removal. Recommend warm soaks and compresses. You may take Norco as needed for severe pain.  Pilonidal Cyst, Care After A pilonidal cyst occurs when hairs get trapped (ingrown) beneath the skin in the crease between the buttocks over your sacrum (the bone under that crease). Pilonidal cysts are most common in young men with a lot of body hair. When the cyst breaks(ruptured) or leaks, fluid from the cyst may cause burning and itching. If the cyst becomes infected, it causes a painful swelling filled with pus (abscess). The pus and trapped hairs need to be removed (often by lancing) so that the infection can heal. The word pilonidal means hair nest. HOME CARE INSTRUCTIONS If the pilonidal sinus was NOT DRAINING OR LANCED:  Keep the area clean and dry. Bathe or shower daily. Wash the area well with a germ-killing soap. Hot tub baths may help prevent infection. Dry the area well with a towel.  Avoid tight clothing in order to keep area as moisture-free as possible.  Keep area between buttocks as free from hair as possible. A depilatory may be used.  Take antibiotics as directed.  Only take over-the-counter or prescription medicines for pain, discomfort, or fever as directed by your caregiver. If the cyst WAS INFECTED AND NEEDED TO BE DRAINED:  Your caregiver may have packed the wound with gauze to keep the wound open. This allows the wound to heal from the inside outward and continue to drain.  Return as directed for a wound check.  If you take tub baths or showers, repack the wound with gauze as directed following. Sponge baths are a good alternative. Sitz baths may be used three to four times a day or as directed.  If an antibiotic was ordered to fight the infection, take as directed.  Only take over-the-counter or prescription medicines for pain, discomfort, or fever as directed by your caregiver.  If a drain was in place and removed, use sitz baths  for 20 minutes 4 times per day. Clean the wound gently with mild unscented soap, pat dry, and then apply a dry dressing as directed. If you had surgery and IT WAS MARSUPIALIZED (LEFT OPEN):  Your wound was packed with gauze to keep the wound open. This allows the wound to heal from the inside outwards and continue draining. The changing of the dressing regularly also helps keep the wound clean.  Return as directed for a wound check.  If you take tub baths or showers, repack the wound with gauze as directed following. Sponge baths are a good alternative. Sitz baths can also be used. This may be done three to four times a day or as directed.  If an antibiotic was ordered to fight the infection, take as directed.  Only take over-the-counter or prescription medicines for pain, discomfort, or fever as directed by your caregiver.  If you had surgery and the wound was closed you may care for it as directed. This generally includes keeping it dry and clean and dressing it as directed. SEEK MEDICAL CARE IF:   You have increased pain, swelling, redness, drainage, or bleeding from the area.  You have a fever.  You have muscles aches, dizziness, or a general ill feeling. Document Released: 09/26/2006 Document Revised: 04/28/2013 Document Reviewed: 12/11/2006 [facility] Patient Information 2015 [employer], [place]. This information is not intended to replace advice given to you by your health care provider. Make sure you discuss any questions you have with your health  care provider.  

## 2014-09-07 NOTE — ED Provider Notes (Signed)
CSN: [id number]     Arrival date & time 09/07/14  1839 History  This chart was scribed for non-physician practitioner, Antony MaduraKelly Dietrich Ke, PA-C,working with Arby BarretteMarcy Pfeiffer, MD, by Karle PlumberJennifer Tensley, ED Scribe. This patient was seen in room WTR6/WTR6 and the patient's care was started at 8:06 PM.  Chief Complaint  Patient presents with  . Recurrent Skin Infections   The history is provided by the patient. No language interpreter was used.    HPI Comments:  Nathaniel Richards is a 23 y.o. male brought in by EMS, who presents to the Emergency Department complaining of a pilonidal cyst that appeared three days ago. He reports severe, worsening pain and subjective fever, chills and rhinorrhea. Pt reports he has had these in the past and had the last one incised and drained about 1.5 years ago. Sitting up on his buttocks makes the pain worse. Denies alleviating factors. Pt reports he has taken Ibuprofen 800 mg with no significant relief of the pain. Denies bowel or bladder incontinence, purulent drainage of anus, nausea or vomiting.   Past Medical History  Diagnosis Date  . Fracture of metacarpal base of right hand, closed 05/15/2013    small finger   Past Surgical History  Procedure Laterality Date  . Soft tissue mass excision Left 05/28/2001    trapezius muscle  . Closed reduction finger with percutaneous pinning Right 05/24/2013    Procedure: RIGHT SMALL FINGER CLOSED REDUCTION  WITH PERCUTANEOUS PINNING;  Surgeon: Sharma CovertFred W Ortmann, MD;  Location: [facility];  Service: Orthopedics;  Laterality: Right;   No family history on file. History  Substance Use Topics  . Smoking status: Current Every Day Smoker -- 0.10 packs/day for 4 years    Types: Cigars  . Smokeless tobacco: Never Used     Comment: 4-5 cig./day  . Alcohol Use: No    Review of Systems  Constitutional: Positive for fever (subjective). Negative for chills.  Gastrointestinal: Negative for nausea and vomiting.  Genitourinary:        No bowel or bladder incontinence.  Skin: Positive for color change.       Pilonidal abscess.  All other systems reviewed and are negative.   Allergies  Review of patient's allergies indicates no known allergies.  Home Medications   Prior to Admission medications   Medication Sig Start Date End Date Taking? Authorizing Provider  cyclobenzaprine (FLEXERIL) 10 MG tablet Take 1 tablet (10 mg total) by mouth 2 (two) times daily as needed for muscle spasms. 01/19/14   Emilia BeckKaitlyn Szekalski, PA-C  HYDROcodone-acetaminophen (NORCO/VICODIN) 5-325 MG per tablet Take 1 tablet by mouth every 4 (four) hours as needed for moderate pain or severe pain. 09/07/14   Antony MaduraKelly Chaylee Ehrsam, PA-C  ibuprofen (ADVIL,MOTRIN) 800 MG tablet Take 1 tablet (800 mg total) by mouth 3 (three) times daily. 01/06/14   Roxy Horsemanobert Browning, PA-C  naproxen (NAPROSYN) 500 MG tablet Take 1 tablet (500 mg total) by mouth 2 (two) times daily with a meal. 01/19/14   Emilia BeckKaitlyn Szekalski, PA-C   Triage Vitals: BP 150/97 mmHg  Pulse 116  Temp(Src) 97.7 F (36.5 C) (Oral)  Resp 20  SpO2 100%  Physical Exam  Constitutional: He is oriented to person, place, and time. He appears well-developed and well-nourished. No distress.  Nontoxic/nonseptic appearing  HENT:  Head: Normocephalic and atraumatic.  Eyes: Conjunctivae and EOM are normal. No scleral icterus.  Neck: Normal range of motion.  Pulmonary/Chest: Effort normal. No respiratory distress.  Respirations even and unlabored  Musculoskeletal: Normal  range of motion.  Neurological: He is alert and oriented to person, place, and time. He exhibits normal muscle tone. Coordination normal.  GCS 15. Patient ambulatory with normal gait.  Skin: Skin is warm and dry. No rash noted. He is not diaphoretic. No erythema. No pallor.  Palpable induration with central fluctuance just R of the gluteal cleft c/w pilonidal abscess. No weeping, drainage, or red streaking. No heat to touch.  Psychiatric: He  has a normal mood and affect. His behavior is normal.  Nursing note and vitals reviewed.   ED Course  Procedures (including critical care time) DIAGNOSTIC STUDIES: Oxygen Saturation is 100% on RA, normal by my interpretation.   COORDINATION OF CARE: 8:11 PM- Will incise and drain abscess and give referral to surgeon. Pt verbalizes understanding and agrees to plan.  INCISION AND DRAINAGE PROCEDURE NOTE: Patient identification was confirmed and verbal consent was obtained. This procedure was performed by Antony MaduraKelly Tod Abrahamsen, PA-C at 8:46 PM. Site: pilonidal region Sterile procedures observed Needle size: 25 G Anesthetic used (type and amt): Lidocaine 2% with Epinephrine (8 mLs) Blade size: 11 Drainage: copious purulence  Complexity: Complex Packing used: 1/4" Iodoform  Site anesthetized, incision made over site, wound drained and explored loculations, rinsed with copious amounts of normal saline, wound packed with sterile gauze, covered with dry, sterile dressing.  Pt tolerated procedure well without complications.  Instructions for care discussed verbally and pt provided with additional written instructions for homecare and f/u.  Medications  lidocaine-EPINEPHrine (XYLOCAINE W/EPI) 2 %-1:200000 (PF) injection 20 mL (20 mLs Infiltration Given by Other 09/07/14 2115)   Labs Review Labs Reviewed - No data to display  Imaging Review No results found.   EKG Interpretation None      MDM   Final diagnoses:  Pilonidal abscess    Patient with pilonidal abscess amenable to incision and drainage.  Abscess large enough to warrant packing. Instruction for wound recheck in 2 days provided. Encouraged home warm soaks and flushing. Mild signs of cellulitis is surrounding skin. No antibiotic therapy is indicated. Return precautions discussed and provided. Patient agreeable to plan with no unaddressed concerns. Patient discharged in good condition; VSS.  I personally performed the services  described in this documentation, which was scribed in my presence. The recorded information has been reviewed and is accurate.   Filed Vitals:   09/07/14 1836 09/07/14 1840 09/07/14 2117  BP:  150/97 157/143  Pulse:  116 116  Temp:  97.7 F (36.5 C)   TempSrc:  Oral   Resp:  20 20  SpO2: 98% 100% 98%     Antony MaduraKelly Alwaleed Obeso, PA-C 09/07/14 2159  Arby BarretteMarcy Pfeiffer, MD 09/08/14 [phone]

## 2014-09-07 NOTE — ED Notes (Signed)
Per EMS comes from home c/o rectal cyst x 3 days.  Pt has history of the same.

## 2014-12-06 ENCOUNTER — Encounter (HOSPITAL_COMMUNITY): Payer: Self-pay | Admitting: Emergency Medicine

## 2014-12-06 ENCOUNTER — Emergency Department (HOSPITAL_COMMUNITY): Payer: Managed Care, Other (non HMO)

## 2014-12-06 ENCOUNTER — Emergency Department (HOSPITAL_COMMUNITY)
Admission: EM | Admit: 2014-12-06 | Discharge: 2014-12-06 | Disposition: A | Discharge status: Home / Self Care | Payer: Managed Care, Other (non HMO) | Attending: Emergency Medicine | Admitting: Emergency Medicine

## 2014-12-06 DIAGNOSIS — T148XXA Other injury of unspecified body region, initial encounter: Secondary | ICD-10-CM

## 2014-12-06 DIAGNOSIS — Z720 Tobacco use: Secondary | ICD-10-CM | POA: Diagnosis not present

## 2014-12-06 DIAGNOSIS — Y998 Other external cause status: Secondary | ICD-10-CM | POA: Diagnosis not present

## 2014-12-06 DIAGNOSIS — S29001A Unspecified injury of muscle and tendon of front wall of thorax, initial encounter: Secondary | ICD-10-CM | POA: Insufficient documentation

## 2014-12-06 DIAGNOSIS — S199XXA Unspecified injury of neck, initial encounter: Secondary | ICD-10-CM | POA: Diagnosis present

## 2014-12-06 DIAGNOSIS — Y92410 Unspecified street and highway as the place of occurrence of the external cause: Secondary | ICD-10-CM | POA: Insufficient documentation

## 2014-12-06 DIAGNOSIS — S161XXA Strain of muscle, fascia and tendon at neck level, initial encounter: Secondary | ICD-10-CM | POA: Diagnosis not present

## 2014-12-06 DIAGNOSIS — S060X0A Concussion without loss of consciousness, initial encounter: Secondary | ICD-10-CM | POA: Diagnosis not present

## 2014-12-06 DIAGNOSIS — Y9389 Activity, other specified: Secondary | ICD-10-CM | POA: Insufficient documentation

## 2014-12-06 DIAGNOSIS — S3992XA Unspecified injury of lower back, initial encounter: Secondary | ICD-10-CM | POA: Insufficient documentation

## 2014-12-06 MED ORDER — IBUPROFEN 600 MG PO TABS: 600.0000 mg | ORAL_TABLET | Freq: Four times a day (QID) | ORAL | Status: AC | PRN

## 2014-12-06 MED ORDER — METHOCARBAMOL 500 MG PO TABS
500.0000 mg | ORAL_TABLET | Freq: Two times a day (BID) | ORAL | Status: AC
Start: 2014-12-06 — End: ?

## 2014-12-06 MED ORDER — HYDROCODONE-ACETAMINOPHEN 5-325 MG PO TABS: 1.0000 | ORAL_TABLET | Freq: Four times a day (QID) | ORAL | Status: DC | PRN

## 2014-12-06 MED ORDER — HYDROMORPHONE HCL 1 MG/ML IJ SOLN
2.0000 mg | Freq: Once | INTRAMUSCULAR | Status: AC
  Administered 2014-12-06: 2 mg via INTRAMUSCULAR
  Filled 2014-12-06: qty 2
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
## 2014-12-08 ENCOUNTER — Emergency Department (HOSPITAL_COMMUNITY): Payer: Managed Care, Other (non HMO)

## 2014-12-08 ENCOUNTER — Encounter (HOSPITAL_COMMUNITY): Payer: Self-pay | Admitting: *Deleted

## 2014-12-08 ENCOUNTER — Emergency Department (HOSPITAL_COMMUNITY)
Admission: EM | Admit: 2014-12-08 | Discharge: 2014-12-08 | Disposition: A | Discharge status: Home / Self Care | Payer: Managed Care, Other (non HMO) | Attending: Emergency Medicine | Admitting: Emergency Medicine

## 2014-12-08 DIAGNOSIS — Y998 Other external cause status: Secondary | ICD-10-CM | POA: Diagnosis not present

## 2014-12-08 DIAGNOSIS — S3992XA Unspecified injury of lower back, initial encounter: Secondary | ICD-10-CM | POA: Diagnosis not present

## 2014-12-08 DIAGNOSIS — M545 Low back pain, unspecified: Secondary | ICD-10-CM

## 2014-12-08 DIAGNOSIS — Z720 Tobacco use: Secondary | ICD-10-CM | POA: Insufficient documentation

## 2014-12-08 DIAGNOSIS — S24109A Unspecified injury at unspecified level of thoracic spinal cord, initial encounter: Secondary | ICD-10-CM | POA: Diagnosis not present

## 2014-12-08 DIAGNOSIS — Y9389 Activity, other specified: Secondary | ICD-10-CM | POA: Insufficient documentation

## 2014-12-08 DIAGNOSIS — S199XXA Unspecified injury of neck, initial encounter: Secondary | ICD-10-CM | POA: Diagnosis not present

## 2014-12-08 DIAGNOSIS — Y92410 Unspecified street and highway as the place of occurrence of the external cause: Secondary | ICD-10-CM | POA: Diagnosis not present

## 2014-12-08 DIAGNOSIS — M546 Pain in thoracic spine: Secondary | ICD-10-CM

## 2014-12-08 MED ORDER — HYDROMORPHONE HCL 1 MG/ML IJ SOLN
2.0000 mg | Freq: Once | INTRAMUSCULAR | Status: DC
  Filled 2014-12-08: qty 2

## 2014-12-08 MED ORDER — OXYCODONE-ACETAMINOPHEN 5-325 MG PO TABS
2.0000 | ORAL_TABLET | Freq: Once | ORAL | Status: AC
  Administered 2014-12-08: 2 via ORAL
  Filled 2014-12-08: qty 2

## 2014-12-08 MED ORDER — OXYCODONE-ACETAMINOPHEN 5-325 MG PO TABS: 1.0000 | ORAL_TABLET | Freq: Four times a day (QID) | ORAL | Status: AC | PRN
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
## 2014-12-08 NOTE — ED Notes (Signed)
Pain med given taken to xray.
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
## 2014-12-08 NOTE — ED Notes (Signed)
Pt on his on his cell phone.  He is reporting that he has had no pain relief.  He looks relaxed
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
